# Patient Record
Sex: Female | Born: 1935 | Race: White | Hispanic: No | Marital: Single | Smoking: Never smoker
Health system: Southern US, Community
[De-identification: ages and names within clinical notes are randomized; demographics above are authoritative.]

---

## 2004-02-23 ENCOUNTER — Other Ambulatory Visit: Payer: Self-pay

## 2004-09-21 ENCOUNTER — Ambulatory Visit: Payer: Self-pay | Admitting: Internal Medicine

## 2004-10-25 ENCOUNTER — Ambulatory Visit: Payer: Self-pay | Admitting: Internal Medicine

## 2004-11-07 ENCOUNTER — Ambulatory Visit: Payer: Self-pay | Admitting: Internal Medicine

## 2004-11-20 ENCOUNTER — Ambulatory Visit: Payer: Self-pay | Admitting: Internal Medicine

## 2005-01-08 ENCOUNTER — Ambulatory Visit: Payer: Self-pay

## 2005-01-15 ENCOUNTER — Ambulatory Visit: Payer: Self-pay | Admitting: Psychiatry

## 2006-10-09 DIAGNOSIS — G40209 Localization-related (focal) (partial) symptomatic epilepsy and epileptic syndromes with complex partial seizures, not intractable, without status epilepticus: Secondary | ICD-10-CM | POA: Diagnosis present

## 2008-12-14 ENCOUNTER — Ambulatory Visit: Payer: Self-pay | Admitting: Internal Medicine

## 2014-07-20 ENCOUNTER — Emergency Department: Payer: Self-pay | Admitting: Emergency Medicine

## 2015-02-15 IMAGING — CT CT HEAD WITHOUT CONTRAST
5 of 6 series · 15 of 33 positions shown, 17 images · non-contrast
Comparison: None

CLINICAL DATA: Fall, was getting groceries out of car, tripped and
struck back of head on curb, alert and oriented, denies loss of
consciousness, initial encounter.

EXAM:
CT HEAD WITHOUT CONTRAST
CT CERVICAL SPINE WITHOUT CONTRAST
TECHNIQUE: Multidetector CT imaging of the head and cervical spine was
performed following the standard protocol without intravenous
contrast. Multiplanar CT image reconstructions of the cervical spine
were also generated.

[Series 3: head bone · axial · 0.44mm/px · z∈[-75,+9]mm · 3 of 108 slices shown]
[im 27/108  bone]
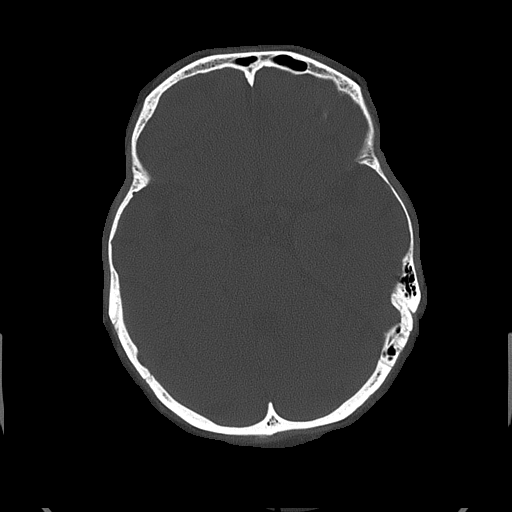
[im 54/108  bone]
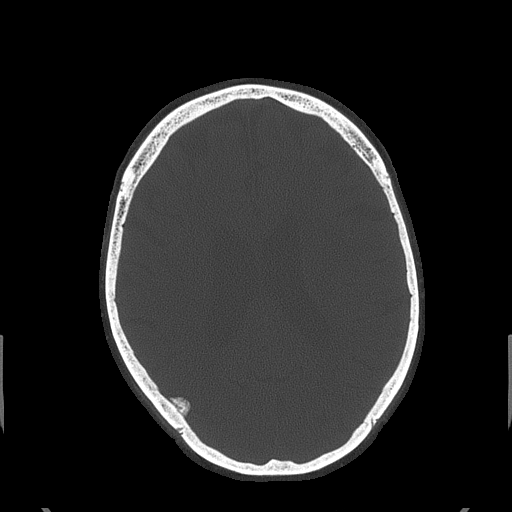
[im 81/108  bone]
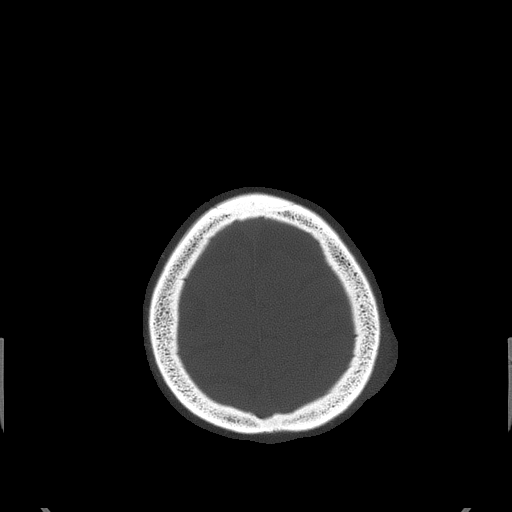

[Series 5: c spine soft · axial · 0.30mm/px · z∈[-232,-178]mm · 2 of 82 slices shown]
[im 28/82  soft-tissue]
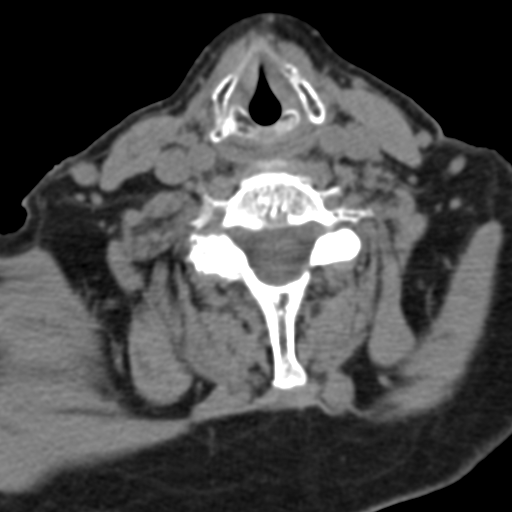
[im 55/82  soft-tissue]
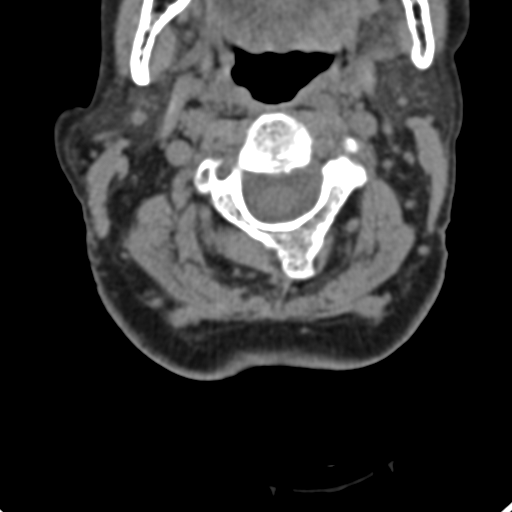

[Series 6: sag bone · sagittal · 0.34mm/px · 5 of 46 slices shown, 6 images]
[im 16/46  bone]
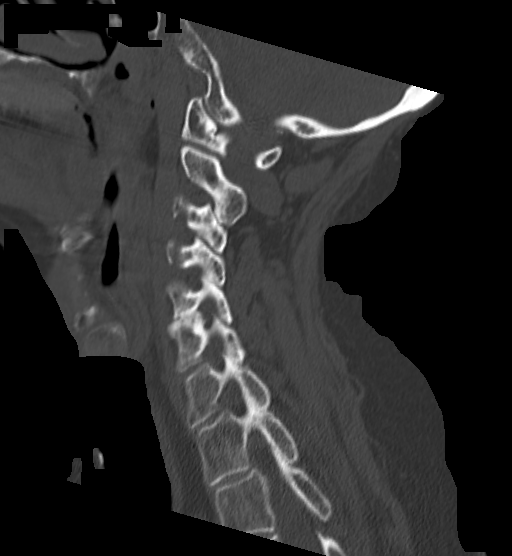
[im 19/46  bone]
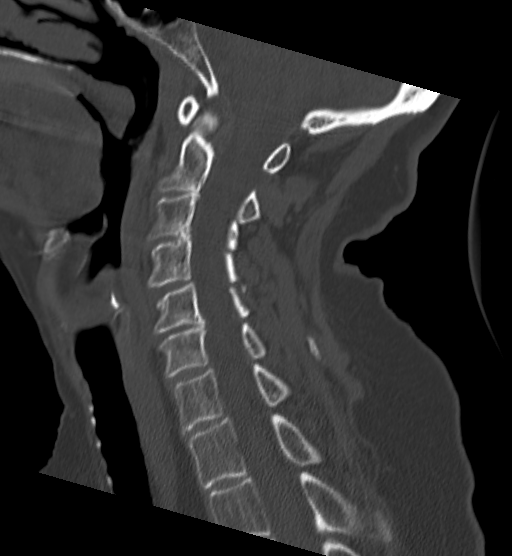
[im 23/46  soft-tissue]
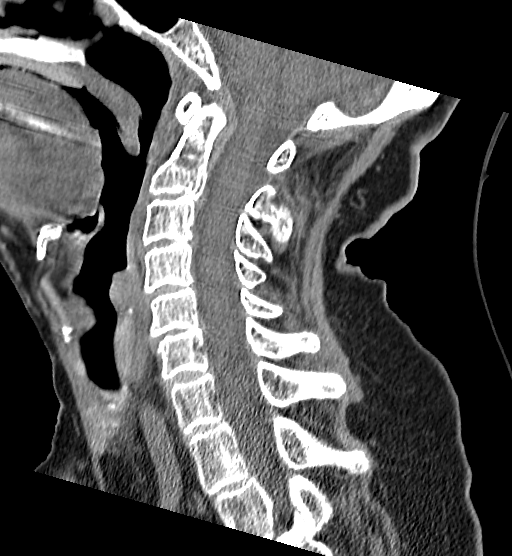
[im 23/46  bone]
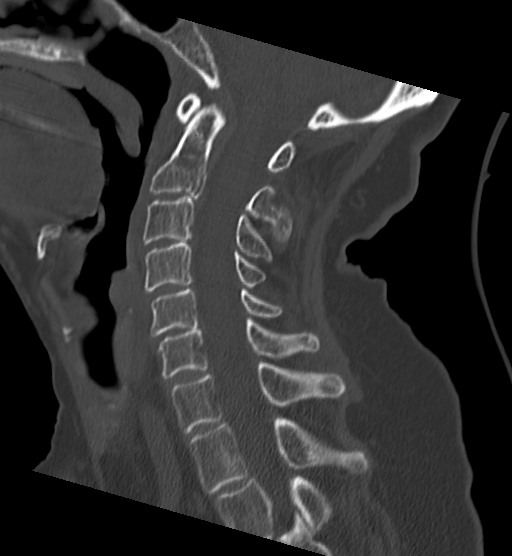
[im 27/46  bone]
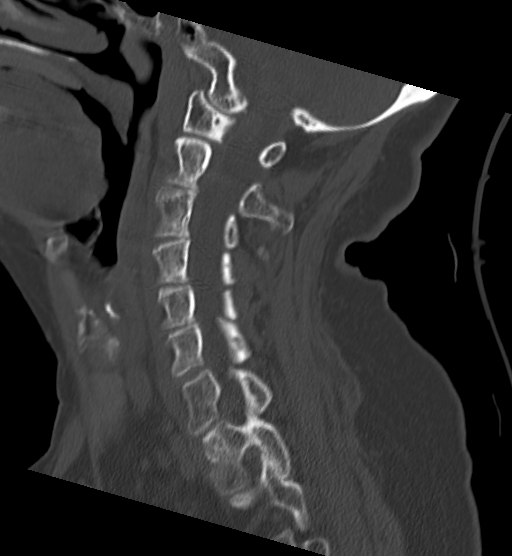
[im 31/46  bone]
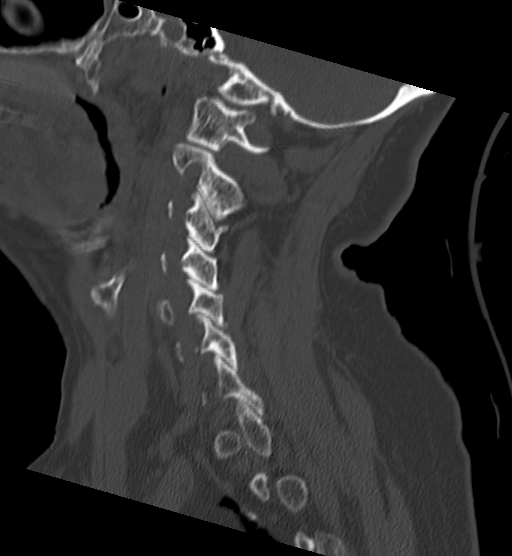

[Series 7: cor bone · coronal · 0.28mm/px · 3 of 44 slices shown]
[im 9/44  bone]
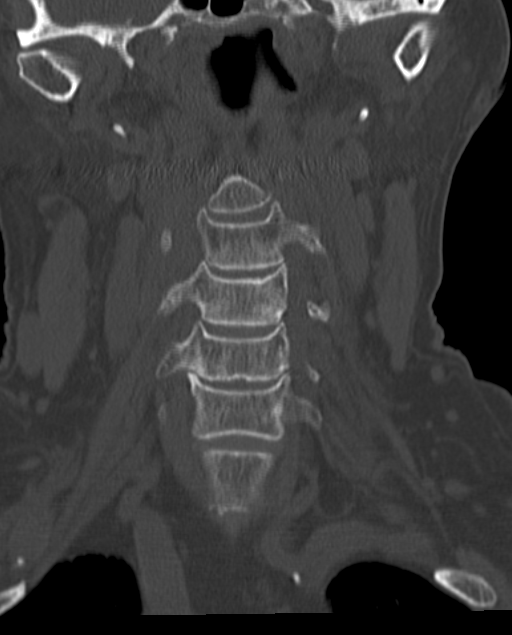
[im 18/44  bone]
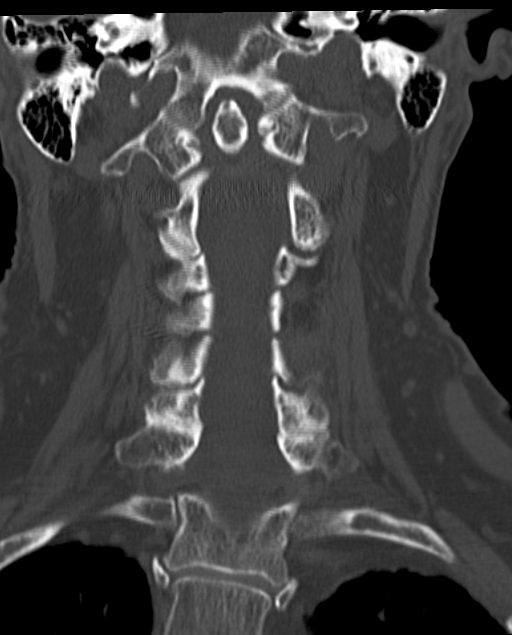
[im 26/44  bone]
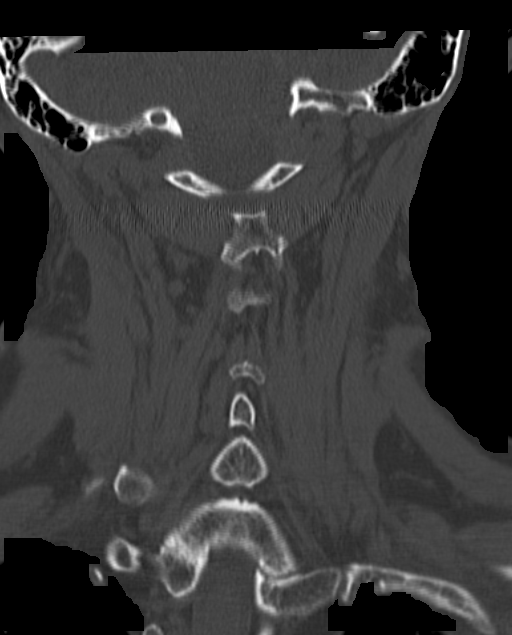

[Series 8: orthogonal axials · axial · 0.29mm/px · z∈[-256,-205]mm · 2 of 81 slices shown, 3 images]
[im 27/81  soft-tissue]
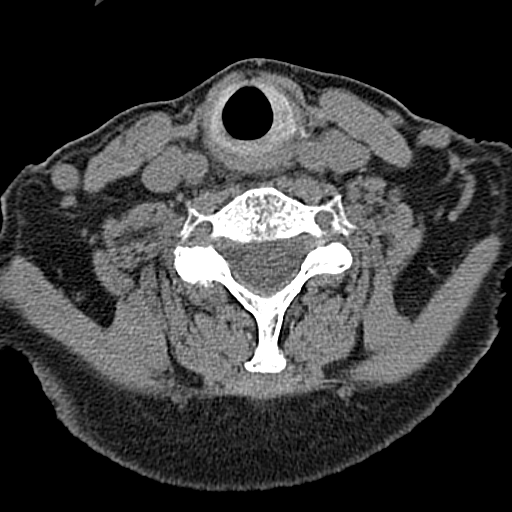
[im 27/81  bone]
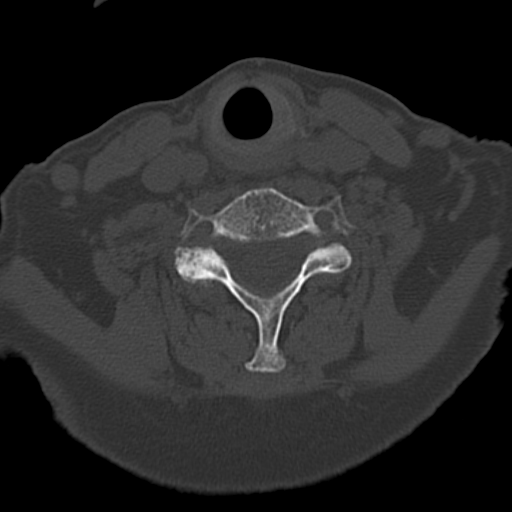
[im 54/81  bone]
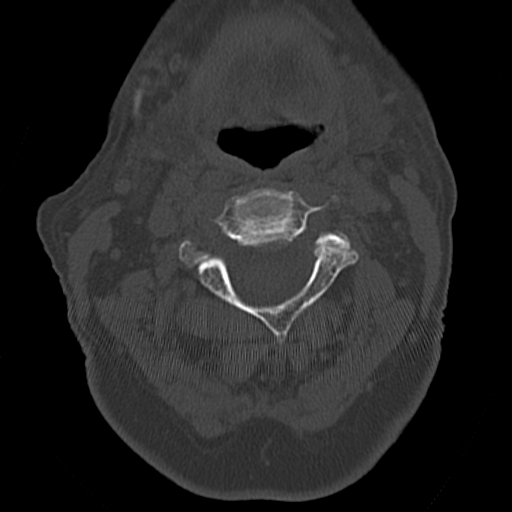

[15 of 33 positions shown; findings below may reference images not displayed]

FINDINGS: CT HEAD FINDINGS

Streak artifacts at skull base.

Generalized atrophy.

Normal ventricular morphology.

No midline shift or mass effect.

Small vessel chronic ischemic changes of deep cerebral white matter.

Small calcified extra-axial nodule at the posterior RIGHT parietal
region 11 x 6 mm question tiny calcified meningioma.

No intracranial hemorrhage, additional mass lesion, or acute
infarction.

Visualized paranasal sinuses and mastoid air cells clear.

Bones unremarkable.

CT CERVICAL SPINE FINDINGS

Visualized skullbase intact.

Prevertebral soft tissues normal thickness.

Bones appear demineralized.

Multilevel facet degenerative changes.

Disc space narrowing at C3-C4, C5-C6, to lesser degrees at remaining
levels.

Vertebral body heights maintained without fracture or subluxation.

Lung apices clear.

Question surgical absence of RIGHT thyroid lobe with tiny nodules in
LEFT thyroid lobe less than 10 mm diameter.
IMPRESSION: Atrophy with small vessel chronic ischemic changes of deep cerebral
white matter.

Suspect small calcified RIGHT parietal meningioma 11 x 6 mm.

No acute intracranial abnormalities.

Osseous demineralization with scattered degenerative disc and facet
disease changes as above.

No acute cervical spine abnormalities.

## 2015-02-15 IMAGING — CR DG LUMBAR SPINE 2-3V
1 series · 3 of 3 positions shown · non-contrast
Comparison: None.

CLINICAL DATA: Acute lower back pain after fall.

EXAM:
LUMBAR SPINE - 2-3 VIEW

[Series 1: dxr lumbar spine ap and lateral · 0.14mm/px · 3 of 3 slices shown]
[im 1/3]
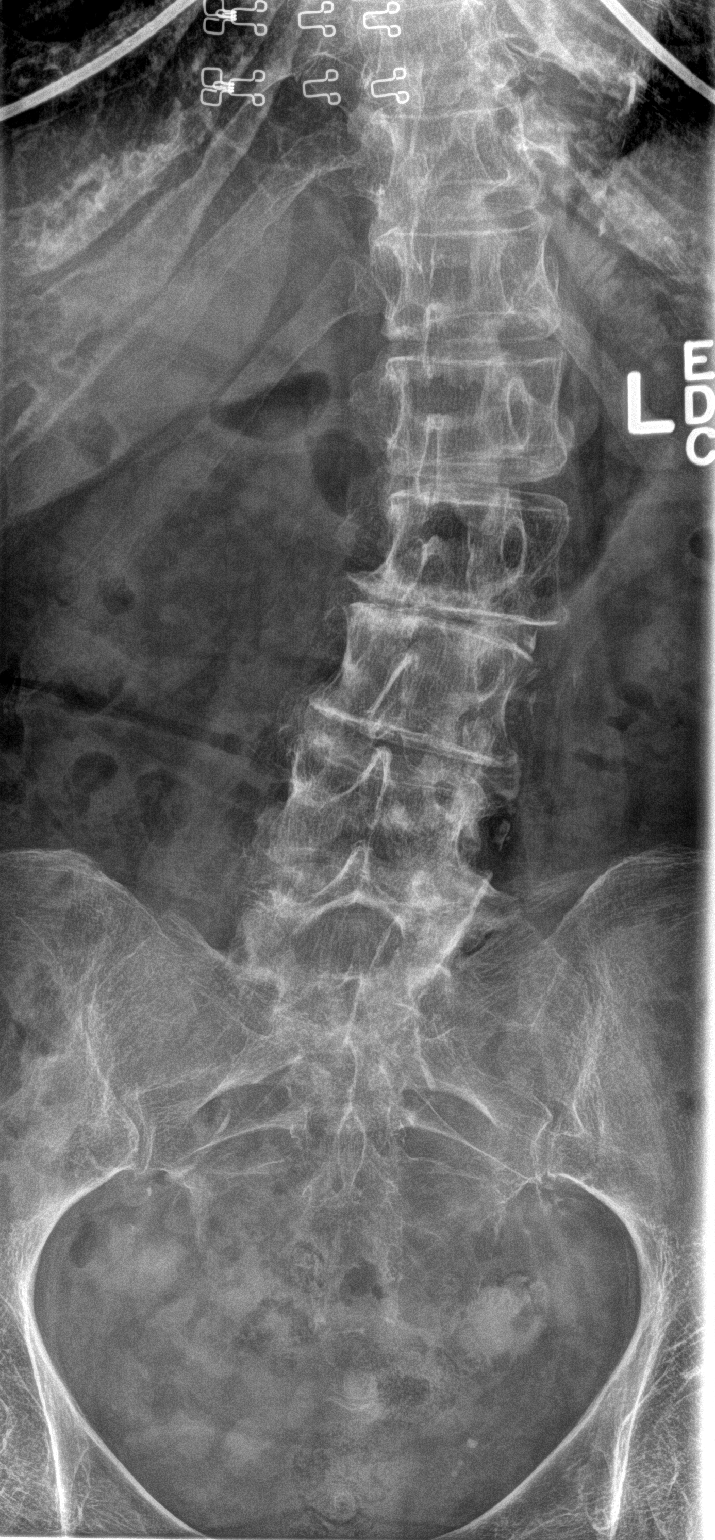
[im 2/3]
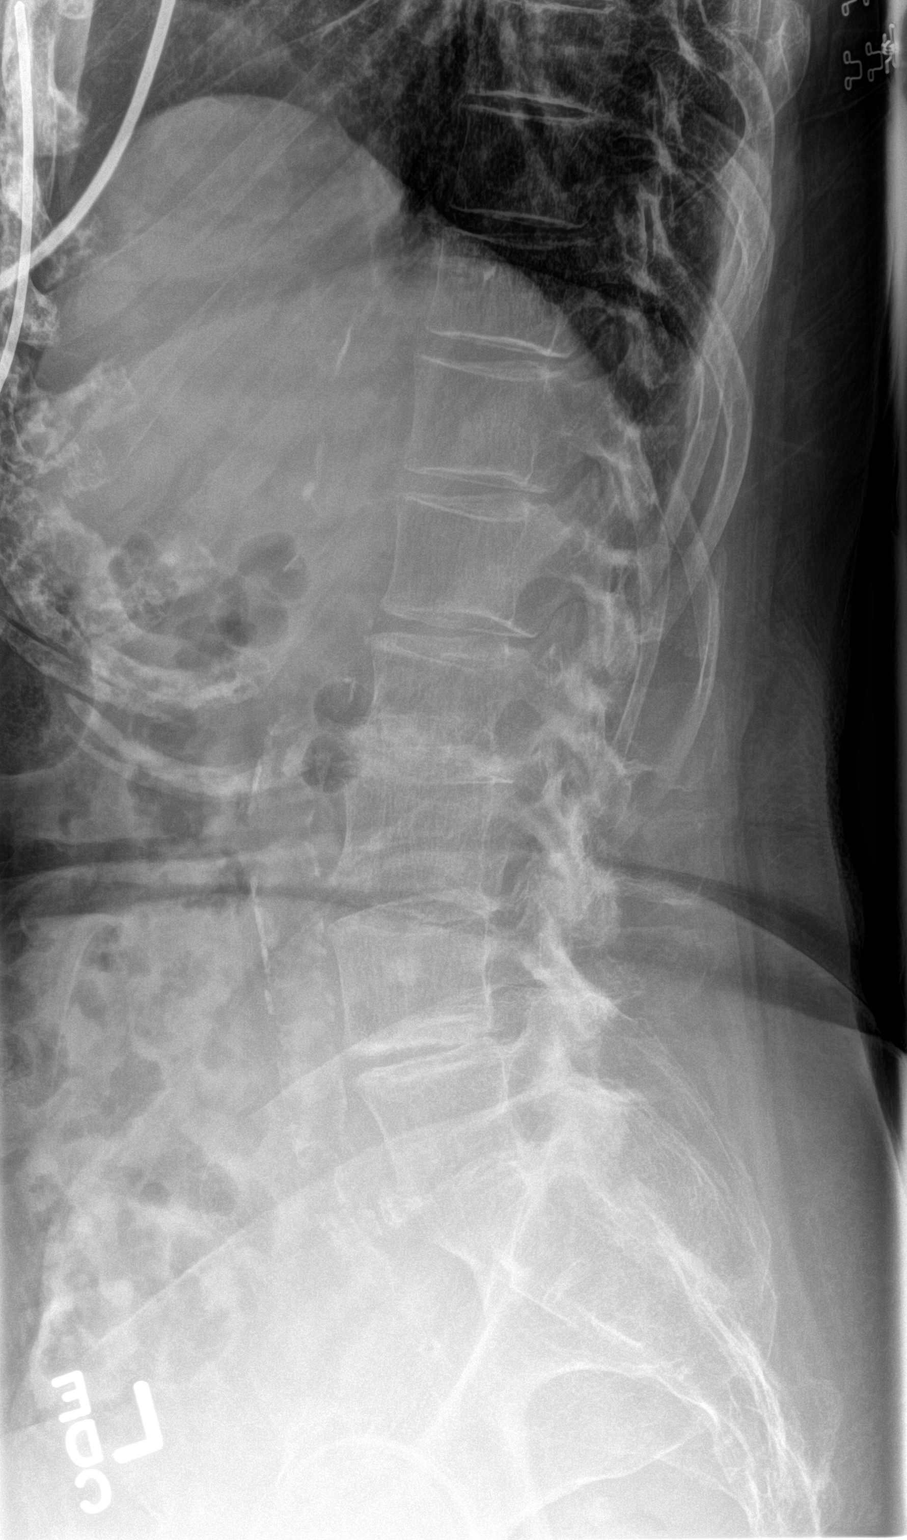
[im 3/3]
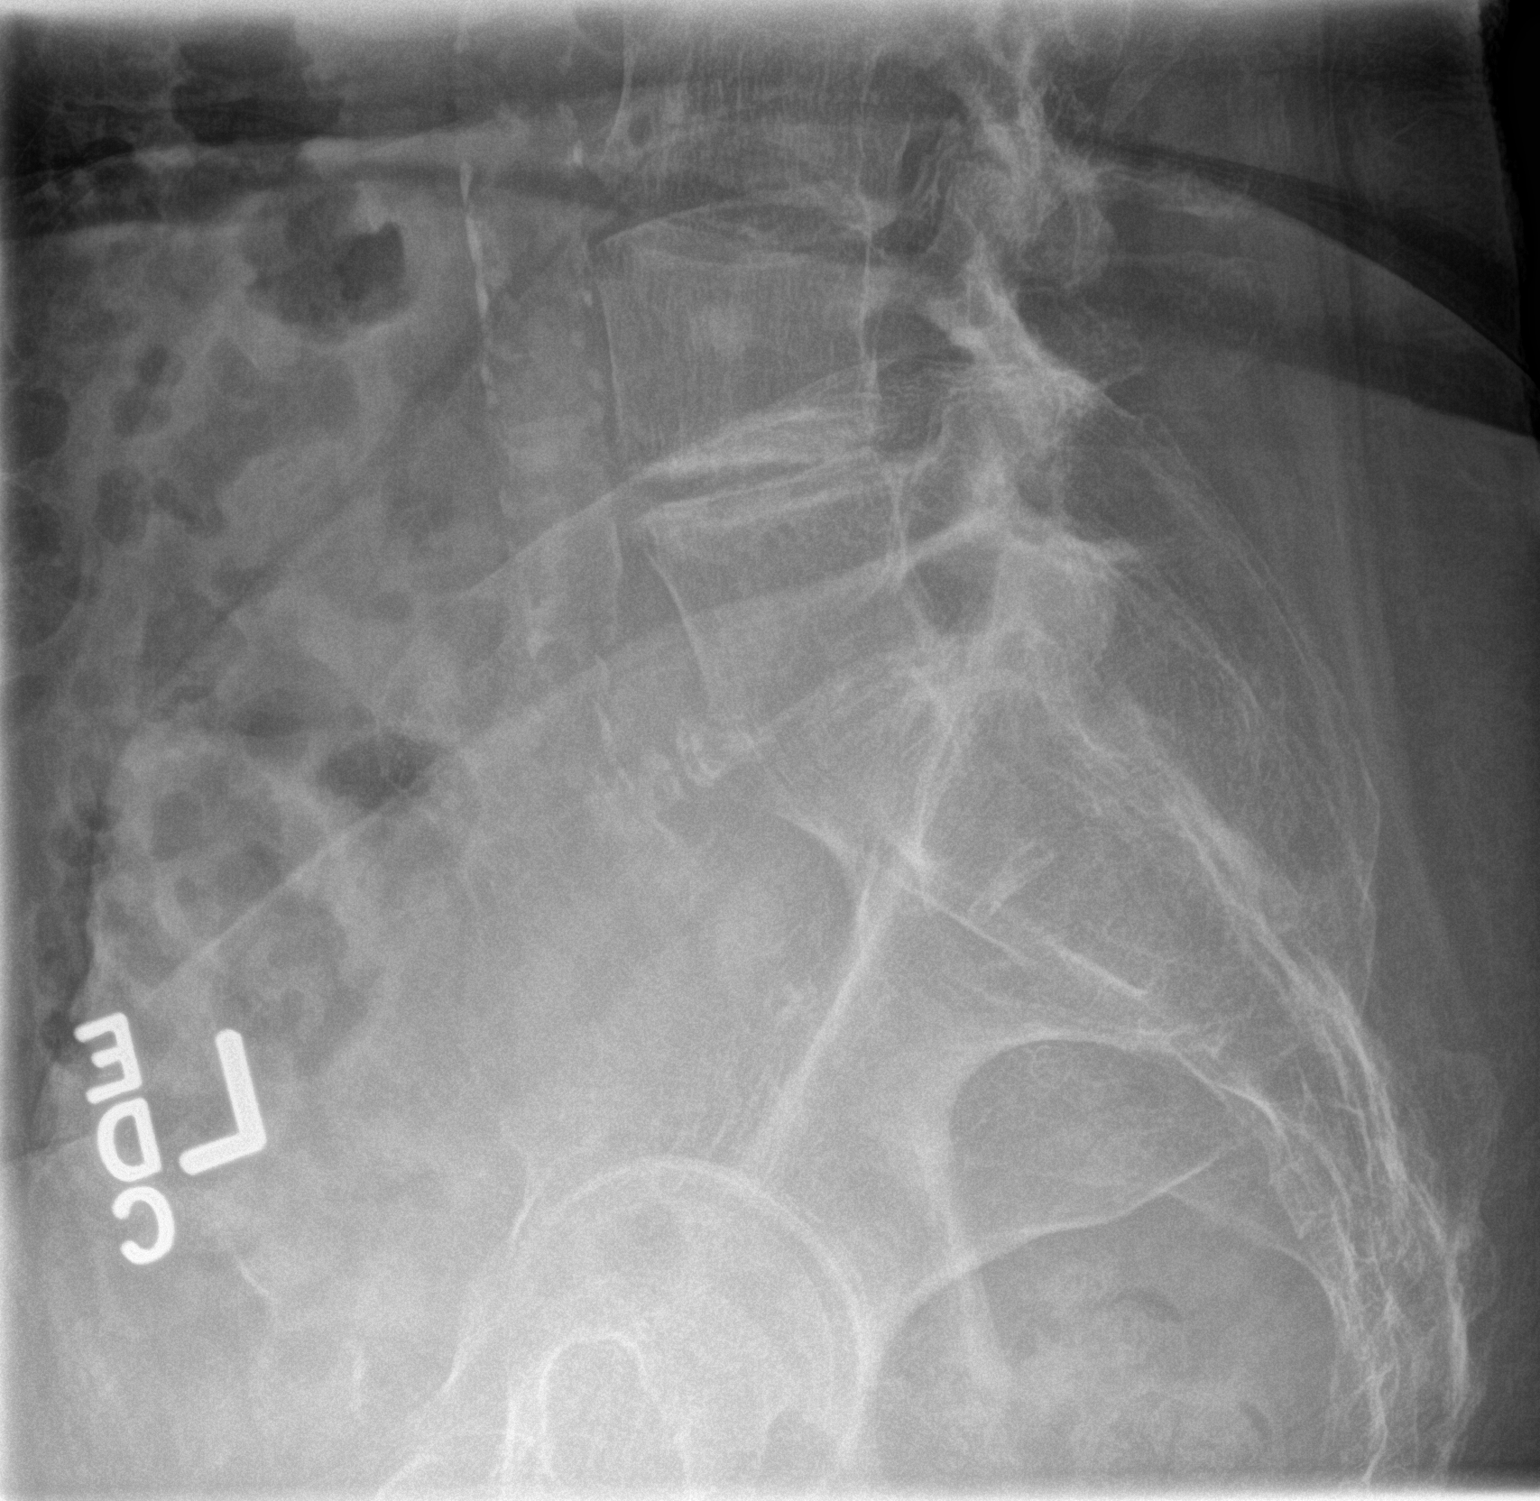

[3 of 3 positions shown; findings below may reference images not displayed]

FINDINGS: No fracture or spondylolisthesis is noted. Diffuse osteopenia is
noted. Atherosclerotic calcifications of abdominal aorta are noted.
Severe degenerative disc disease is noted at L2-3 and L5-S1. Mild
degenerative disc disease is noted at L4-5. Moderate levoscoliosis
of lumbar spine is noted.
IMPRESSION: Multilevel degenerative disc disease. No acute abnormality seen in
the lumbar spine.

## 2023-04-01 ENCOUNTER — Telehealth: Payer: Self-pay

## 2023-04-01 NOTE — Telephone Encounter (Unsigned)
Copied from CRM [id number]. Topic: Appointment Scheduling - Scheduling Inquiry for Clinic >> Apr 01, 2023  3:20 PM Franchot Heidelberg wrote: Reason for CRM: Pt's son called to report that the patient was established with Dr. Sherrie Mustache 9 years ago when he was with [facility]. Pt's son wants to know if Dr. Sherrie Mustache is willing to take her on as a returning patient  Best contact: [phone] Otilio Carpen

## 2023-04-02 NOTE — Telephone Encounter (Signed)
I've never been at [facility], and am not taking new patients. Maybe Dr. Payton Mccallum or Roxan Hockey can see her.

## 2023-04-03 NOTE — Telephone Encounter (Signed)
Left vm for to return call.  Okay for [facility] to advise and make appt with another provider that is accepting new pt's if they want.

## 2023-05-26 ENCOUNTER — Ambulatory Visit: Payer: Self-pay | Admitting: Physician Assistant

## 2023-06-05 DIAGNOSIS — G20A1 Parkinson's disease without dyskinesia, without mention of fluctuations: Secondary | ICD-10-CM | POA: Diagnosis not present

## 2023-06-05 DIAGNOSIS — I10 Essential (primary) hypertension: Secondary | ICD-10-CM | POA: Diagnosis not present

## 2023-06-05 DIAGNOSIS — R569 Unspecified convulsions: Secondary | ICD-10-CM | POA: Diagnosis not present

## 2023-06-05 DIAGNOSIS — R32 Unspecified urinary incontinence: Secondary | ICD-10-CM | POA: Diagnosis not present

## 2023-06-05 DIAGNOSIS — R1310 Dysphagia, unspecified: Secondary | ICD-10-CM | POA: Diagnosis not present

## 2023-06-07 DIAGNOSIS — R569 Unspecified convulsions: Secondary | ICD-10-CM | POA: Diagnosis not present

## 2023-06-07 DIAGNOSIS — G20A1 Parkinson's disease without dyskinesia, without mention of fluctuations: Secondary | ICD-10-CM | POA: Diagnosis not present

## 2023-06-07 DIAGNOSIS — R1310 Dysphagia, unspecified: Secondary | ICD-10-CM | POA: Diagnosis not present

## 2023-06-07 DIAGNOSIS — I10 Essential (primary) hypertension: Secondary | ICD-10-CM | POA: Diagnosis not present

## 2023-06-07 DIAGNOSIS — R32 Unspecified urinary incontinence: Secondary | ICD-10-CM | POA: Diagnosis not present

## 2023-06-09 DIAGNOSIS — G20A1 Parkinson's disease without dyskinesia, without mention of fluctuations: Secondary | ICD-10-CM | POA: Diagnosis not present

## 2023-06-09 DIAGNOSIS — R32 Unspecified urinary incontinence: Secondary | ICD-10-CM | POA: Diagnosis not present

## 2023-06-09 DIAGNOSIS — R1310 Dysphagia, unspecified: Secondary | ICD-10-CM | POA: Diagnosis not present

## 2023-06-09 DIAGNOSIS — R569 Unspecified convulsions: Secondary | ICD-10-CM | POA: Diagnosis not present

## 2023-06-09 DIAGNOSIS — I10 Essential (primary) hypertension: Secondary | ICD-10-CM | POA: Diagnosis not present

## 2023-06-10 DIAGNOSIS — G20A1 Parkinson's disease without dyskinesia, without mention of fluctuations: Secondary | ICD-10-CM | POA: Diagnosis not present

## 2023-06-12 DIAGNOSIS — I10 Essential (primary) hypertension: Secondary | ICD-10-CM | POA: Diagnosis not present

## 2023-06-12 DIAGNOSIS — R1310 Dysphagia, unspecified: Secondary | ICD-10-CM | POA: Diagnosis not present

## 2023-06-12 DIAGNOSIS — R569 Unspecified convulsions: Secondary | ICD-10-CM | POA: Diagnosis not present

## 2023-06-12 DIAGNOSIS — G20A1 Parkinson's disease without dyskinesia, without mention of fluctuations: Secondary | ICD-10-CM | POA: Diagnosis not present

## 2023-06-12 DIAGNOSIS — R32 Unspecified urinary incontinence: Secondary | ICD-10-CM | POA: Diagnosis not present

## 2023-06-13 DIAGNOSIS — G20A1 Parkinson's disease without dyskinesia, without mention of fluctuations: Secondary | ICD-10-CM | POA: Diagnosis not present

## 2023-06-13 DIAGNOSIS — I10 Essential (primary) hypertension: Secondary | ICD-10-CM | POA: Diagnosis not present

## 2023-06-13 DIAGNOSIS — R1310 Dysphagia, unspecified: Secondary | ICD-10-CM | POA: Diagnosis not present

## 2023-06-13 DIAGNOSIS — R569 Unspecified convulsions: Secondary | ICD-10-CM | POA: Diagnosis not present

## 2023-06-13 DIAGNOSIS — R32 Unspecified urinary incontinence: Secondary | ICD-10-CM | POA: Diagnosis not present

## 2023-06-25 DIAGNOSIS — R569 Unspecified convulsions: Secondary | ICD-10-CM | POA: Diagnosis not present

## 2023-06-25 DIAGNOSIS — R32 Unspecified urinary incontinence: Secondary | ICD-10-CM | POA: Diagnosis not present

## 2023-06-25 DIAGNOSIS — I10 Essential (primary) hypertension: Secondary | ICD-10-CM | POA: Diagnosis not present

## 2023-06-25 DIAGNOSIS — G20A1 Parkinson's disease without dyskinesia, without mention of fluctuations: Secondary | ICD-10-CM | POA: Diagnosis not present

## 2023-06-25 DIAGNOSIS — R1310 Dysphagia, unspecified: Secondary | ICD-10-CM | POA: Diagnosis not present

## 2023-06-26 DIAGNOSIS — R569 Unspecified convulsions: Secondary | ICD-10-CM | POA: Diagnosis not present

## 2023-06-26 DIAGNOSIS — R1310 Dysphagia, unspecified: Secondary | ICD-10-CM | POA: Diagnosis not present

## 2023-06-26 DIAGNOSIS — G20A1 Parkinson's disease without dyskinesia, without mention of fluctuations: Secondary | ICD-10-CM | POA: Diagnosis not present

## 2023-06-26 DIAGNOSIS — R32 Unspecified urinary incontinence: Secondary | ICD-10-CM | POA: Diagnosis not present

## 2023-06-26 DIAGNOSIS — I10 Essential (primary) hypertension: Secondary | ICD-10-CM | POA: Diagnosis not present

## 2023-06-27 DIAGNOSIS — R1310 Dysphagia, unspecified: Secondary | ICD-10-CM | POA: Diagnosis not present

## 2023-06-27 DIAGNOSIS — R569 Unspecified convulsions: Secondary | ICD-10-CM | POA: Diagnosis not present

## 2023-06-27 DIAGNOSIS — G20A1 Parkinson's disease without dyskinesia, without mention of fluctuations: Secondary | ICD-10-CM | POA: Diagnosis not present

## 2023-06-27 DIAGNOSIS — R32 Unspecified urinary incontinence: Secondary | ICD-10-CM | POA: Diagnosis not present

## 2023-06-27 DIAGNOSIS — I10 Essential (primary) hypertension: Secondary | ICD-10-CM | POA: Diagnosis not present

## 2023-07-03 DIAGNOSIS — G20A1 Parkinson's disease without dyskinesia, without mention of fluctuations: Secondary | ICD-10-CM | POA: Diagnosis not present

## 2023-07-03 DIAGNOSIS — R1310 Dysphagia, unspecified: Secondary | ICD-10-CM | POA: Diagnosis not present

## 2023-07-03 DIAGNOSIS — R32 Unspecified urinary incontinence: Secondary | ICD-10-CM | POA: Diagnosis not present

## 2023-07-03 DIAGNOSIS — I10 Essential (primary) hypertension: Secondary | ICD-10-CM | POA: Diagnosis not present

## 2023-07-03 DIAGNOSIS — R569 Unspecified convulsions: Secondary | ICD-10-CM | POA: Diagnosis not present

## 2023-07-04 DIAGNOSIS — I10 Essential (primary) hypertension: Secondary | ICD-10-CM | POA: Diagnosis not present

## 2023-07-04 DIAGNOSIS — G20A1 Parkinson's disease without dyskinesia, without mention of fluctuations: Secondary | ICD-10-CM | POA: Diagnosis not present

## 2023-07-04 DIAGNOSIS — R1310 Dysphagia, unspecified: Secondary | ICD-10-CM | POA: Diagnosis not present

## 2023-07-04 DIAGNOSIS — R569 Unspecified convulsions: Secondary | ICD-10-CM | POA: Diagnosis not present

## 2023-07-04 DIAGNOSIS — R32 Unspecified urinary incontinence: Secondary | ICD-10-CM | POA: Diagnosis not present

## 2023-07-05 DIAGNOSIS — R569 Unspecified convulsions: Secondary | ICD-10-CM | POA: Diagnosis not present

## 2023-07-05 DIAGNOSIS — I10 Essential (primary) hypertension: Secondary | ICD-10-CM | POA: Diagnosis not present

## 2023-07-05 DIAGNOSIS — R32 Unspecified urinary incontinence: Secondary | ICD-10-CM | POA: Diagnosis not present

## 2023-07-05 DIAGNOSIS — G20A1 Parkinson's disease without dyskinesia, without mention of fluctuations: Secondary | ICD-10-CM | POA: Diagnosis not present

## 2023-07-05 DIAGNOSIS — R1310 Dysphagia, unspecified: Secondary | ICD-10-CM | POA: Diagnosis not present

## 2023-07-09 DIAGNOSIS — R32 Unspecified urinary incontinence: Secondary | ICD-10-CM | POA: Diagnosis not present

## 2023-07-09 DIAGNOSIS — G20A1 Parkinson's disease without dyskinesia, without mention of fluctuations: Secondary | ICD-10-CM | POA: Diagnosis not present

## 2023-07-09 DIAGNOSIS — I10 Essential (primary) hypertension: Secondary | ICD-10-CM | POA: Diagnosis not present

## 2023-07-09 DIAGNOSIS — R569 Unspecified convulsions: Secondary | ICD-10-CM | POA: Diagnosis not present

## 2023-07-09 DIAGNOSIS — R1310 Dysphagia, unspecified: Secondary | ICD-10-CM | POA: Diagnosis not present

## 2023-07-10 DIAGNOSIS — G20A1 Parkinson's disease without dyskinesia, without mention of fluctuations: Secondary | ICD-10-CM | POA: Diagnosis not present

## 2023-07-10 DIAGNOSIS — R569 Unspecified convulsions: Secondary | ICD-10-CM | POA: Diagnosis not present

## 2023-07-10 DIAGNOSIS — R32 Unspecified urinary incontinence: Secondary | ICD-10-CM | POA: Diagnosis not present

## 2023-07-10 DIAGNOSIS — R1310 Dysphagia, unspecified: Secondary | ICD-10-CM | POA: Diagnosis not present

## 2023-07-10 DIAGNOSIS — I10 Essential (primary) hypertension: Secondary | ICD-10-CM | POA: Diagnosis not present

## 2023-07-11 DIAGNOSIS — L918 Other hypertrophic disorders of the skin: Secondary | ICD-10-CM | POA: Diagnosis not present

## 2023-07-11 DIAGNOSIS — G20A1 Parkinson's disease without dyskinesia, without mention of fluctuations: Secondary | ICD-10-CM | POA: Diagnosis not present

## 2023-07-11 DIAGNOSIS — F068 Other specified mental disorders due to known physiological condition: Secondary | ICD-10-CM | POA: Diagnosis not present

## 2023-07-11 DIAGNOSIS — N3946 Mixed incontinence: Secondary | ICD-10-CM | POA: Diagnosis not present

## 2023-07-17 DIAGNOSIS — R1310 Dysphagia, unspecified: Secondary | ICD-10-CM | POA: Diagnosis not present

## 2023-07-17 DIAGNOSIS — G20A1 Parkinson's disease without dyskinesia, without mention of fluctuations: Secondary | ICD-10-CM | POA: Diagnosis not present

## 2023-07-17 DIAGNOSIS — R32 Unspecified urinary incontinence: Secondary | ICD-10-CM | POA: Diagnosis not present

## 2023-07-17 DIAGNOSIS — R569 Unspecified convulsions: Secondary | ICD-10-CM | POA: Diagnosis not present

## 2023-07-17 DIAGNOSIS — I10 Essential (primary) hypertension: Secondary | ICD-10-CM | POA: Diagnosis not present

## 2023-07-18 DIAGNOSIS — R1310 Dysphagia, unspecified: Secondary | ICD-10-CM | POA: Diagnosis not present

## 2023-07-18 DIAGNOSIS — I10 Essential (primary) hypertension: Secondary | ICD-10-CM | POA: Diagnosis not present

## 2023-07-18 DIAGNOSIS — R32 Unspecified urinary incontinence: Secondary | ICD-10-CM | POA: Diagnosis not present

## 2023-07-18 DIAGNOSIS — R569 Unspecified convulsions: Secondary | ICD-10-CM | POA: Diagnosis not present

## 2023-07-18 DIAGNOSIS — G20A1 Parkinson's disease without dyskinesia, without mention of fluctuations: Secondary | ICD-10-CM | POA: Diagnosis not present

## 2023-07-22 DIAGNOSIS — G20A1 Parkinson's disease without dyskinesia, without mention of fluctuations: Secondary | ICD-10-CM | POA: Diagnosis not present

## 2023-07-22 DIAGNOSIS — R1310 Dysphagia, unspecified: Secondary | ICD-10-CM | POA: Diagnosis not present

## 2023-07-22 DIAGNOSIS — R569 Unspecified convulsions: Secondary | ICD-10-CM | POA: Diagnosis not present

## 2023-07-22 DIAGNOSIS — I10 Essential (primary) hypertension: Secondary | ICD-10-CM | POA: Diagnosis not present

## 2023-07-22 DIAGNOSIS — R32 Unspecified urinary incontinence: Secondary | ICD-10-CM | POA: Diagnosis not present

## 2023-07-23 DIAGNOSIS — R1310 Dysphagia, unspecified: Secondary | ICD-10-CM | POA: Diagnosis not present

## 2023-07-23 DIAGNOSIS — R569 Unspecified convulsions: Secondary | ICD-10-CM | POA: Diagnosis not present

## 2023-07-23 DIAGNOSIS — R32 Unspecified urinary incontinence: Secondary | ICD-10-CM | POA: Diagnosis not present

## 2023-07-23 DIAGNOSIS — G20A1 Parkinson's disease without dyskinesia, without mention of fluctuations: Secondary | ICD-10-CM | POA: Diagnosis not present

## 2023-07-23 DIAGNOSIS — I10 Essential (primary) hypertension: Secondary | ICD-10-CM | POA: Diagnosis not present

## 2023-08-01 DIAGNOSIS — G20A1 Parkinson's disease without dyskinesia, without mention of fluctuations: Secondary | ICD-10-CM | POA: Diagnosis not present

## 2023-08-01 DIAGNOSIS — R1310 Dysphagia, unspecified: Secondary | ICD-10-CM | POA: Diagnosis not present

## 2023-08-01 DIAGNOSIS — I10 Essential (primary) hypertension: Secondary | ICD-10-CM | POA: Diagnosis not present

## 2023-08-01 DIAGNOSIS — R32 Unspecified urinary incontinence: Secondary | ICD-10-CM | POA: Diagnosis not present

## 2023-08-01 DIAGNOSIS — R569 Unspecified convulsions: Secondary | ICD-10-CM | POA: Diagnosis not present

## 2023-11-27 ENCOUNTER — Observation Stay
Admission: EM | Admit: 2023-11-27 | Discharge: 2023-11-29 | Disposition: A | Discharge status: Hospice | Attending: Internal Medicine | Admitting: Internal Medicine

## 2023-11-27 ENCOUNTER — Emergency Department

## 2023-11-27 ENCOUNTER — Observation Stay

## 2023-11-27 ENCOUNTER — Other Ambulatory Visit: Payer: Self-pay

## 2023-11-27 DIAGNOSIS — R41841 Cognitive communication deficit: Secondary | ICD-10-CM | POA: Diagnosis not present

## 2023-11-27 DIAGNOSIS — R32 Unspecified urinary incontinence: Secondary | ICD-10-CM | POA: Insufficient documentation

## 2023-11-27 DIAGNOSIS — I671 Cerebral aneurysm, nonruptured: Secondary | ICD-10-CM | POA: Insufficient documentation

## 2023-11-27 DIAGNOSIS — K5900 Constipation, unspecified: Secondary | ICD-10-CM | POA: Diagnosis present

## 2023-11-27 DIAGNOSIS — D329 Benign neoplasm of meninges, unspecified: Secondary | ICD-10-CM | POA: Diagnosis not present

## 2023-11-27 DIAGNOSIS — Z79899 Other long term (current) drug therapy: Secondary | ICD-10-CM | POA: Diagnosis not present

## 2023-11-27 DIAGNOSIS — I10 Essential (primary) hypertension: Secondary | ICD-10-CM | POA: Diagnosis not present

## 2023-11-27 DIAGNOSIS — I6601 Occlusion and stenosis of right middle cerebral artery: Secondary | ICD-10-CM | POA: Diagnosis not present

## 2023-11-27 DIAGNOSIS — F419 Anxiety disorder, unspecified: Secondary | ICD-10-CM

## 2023-11-27 DIAGNOSIS — I708 Atherosclerosis of other arteries: Secondary | ICD-10-CM | POA: Diagnosis not present

## 2023-11-27 DIAGNOSIS — F0280 Dementia in other diseases classified elsewhere without behavioral disturbance: Secondary | ICD-10-CM | POA: Insufficient documentation

## 2023-11-27 DIAGNOSIS — G40109 Localization-related (focal) (partial) symptomatic epilepsy and epileptic syndromes with simple partial seizures, not intractable, without status epilepticus: Secondary | ICD-10-CM | POA: Diagnosis not present

## 2023-11-27 DIAGNOSIS — I6782 Cerebral ischemia: Secondary | ICD-10-CM | POA: Diagnosis not present

## 2023-11-27 DIAGNOSIS — I700 Atherosclerosis of aorta: Secondary | ICD-10-CM | POA: Insufficient documentation

## 2023-11-27 DIAGNOSIS — R400 Somnolence: Secondary | ICD-10-CM | POA: Diagnosis present

## 2023-11-27 DIAGNOSIS — I63511 Cerebral infarction due to unspecified occlusion or stenosis of right middle cerebral artery: Secondary | ICD-10-CM | POA: Diagnosis not present

## 2023-11-27 DIAGNOSIS — I6523 Occlusion and stenosis of bilateral carotid arteries: Secondary | ICD-10-CM | POA: Diagnosis not present

## 2023-11-27 DIAGNOSIS — D72829 Elevated white blood cell count, unspecified: Secondary | ICD-10-CM | POA: Insufficient documentation

## 2023-11-27 DIAGNOSIS — R2990 Unspecified symptoms and signs involving the nervous system: Secondary | ICD-10-CM | POA: Diagnosis present

## 2023-11-27 DIAGNOSIS — R29818 Other symptoms and signs involving the nervous system: Secondary | ICD-10-CM | POA: Diagnosis present

## 2023-11-27 DIAGNOSIS — G20C Parkinsonism, unspecified: Secondary | ICD-10-CM | POA: Diagnosis not present

## 2023-11-27 DIAGNOSIS — F418 Other specified anxiety disorders: Secondary | ICD-10-CM | POA: Diagnosis not present

## 2023-11-27 DIAGNOSIS — I639 Cerebral infarction, unspecified: Principal | ICD-10-CM

## 2023-11-27 DIAGNOSIS — R569 Unspecified convulsions: Secondary | ICD-10-CM

## 2023-11-27 DIAGNOSIS — F32A Depression, unspecified: Secondary | ICD-10-CM | POA: Diagnosis present

## 2023-11-27 DIAGNOSIS — G40209 Localization-related (focal) (partial) symptomatic epilepsy and epileptic syndromes with complex partial seizures, not intractable, without status epilepticus: Secondary | ICD-10-CM | POA: Diagnosis not present

## 2023-11-27 DIAGNOSIS — F0390 Unspecified dementia without behavioral disturbance: Secondary | ICD-10-CM

## 2023-11-27 DIAGNOSIS — G20A1 Parkinson's disease without dyskinesia, without mention of fluctuations: Secondary | ICD-10-CM | POA: Diagnosis present

## 2023-11-27 LAB — COMPREHENSIVE METABOLIC PANEL WITH GFR
ALT: 7 U/L (ref 0–44)
AST: 19 U/L (ref 15–41)
Albumin: 3.3 g/dL — ABNORMAL LOW (ref 3.5–5.0)
Alkaline Phosphatase: 47 U/L (ref 38–126)
Anion gap: 11 (ref 5–15)
BUN: 19 mg/dL (ref 8–23)
CO2: 26 mmol/L (ref 22–32)
Calcium: 8.4 mg/dL — ABNORMAL LOW (ref 8.9–10.3)
Chloride: 100 mmol/L (ref 98–111)
Creatinine, Ser: 0.75 mg/dL (ref 0.44–1.00)
GFR, Estimated: >60 mL/min (ref >60)
Glucose, Bld: 117 mg/dL — ABNORMAL HIGH (ref 70–99)
Potassium: 3.7 mmol/L (ref 3.5–5.1)
Sodium: 137 mmol/L (ref 135–145)
Total Bilirubin: 0.9 mg/dL (ref 0.0–1.2)
Total Protein: 6.5 g/dL (ref 6.5–8.1)

## 2023-11-27 LAB — URINE DRUG SCREEN, QUALITATIVE (ARMC ONLY)
Amphetamines, Ur Screen: NOT DETECTED
Barbiturates, Ur Screen: NOT DETECTED
Benzodiazepine, Ur Scrn: NOT DETECTED
Cannabinoid 50 Ng, Ur ~~LOC~~: NOT DETECTED
Cocaine Metabolite,Ur ~~LOC~~: NOT DETECTED
MDMA (Ecstasy)Ur Screen: NOT DETECTED
Methadone Scn, Ur: NOT DETECTED
Opiate, Ur Screen: NOT DETECTED
Phencyclidine (PCP) Ur S: NOT DETECTED
Tricyclic, Ur Screen: NOT DETECTED

## 2023-11-27 LAB — DIFFERENTIAL
Abs Immature Granulocytes: 0.06 10*3/uL (ref 0.00–0.07)
Basophils Absolute: 0 10*3/uL (ref 0.0–0.1)
Basophils Relative: 0 %
Eosinophils Absolute: 0.1 10*3/uL (ref 0.0–0.5)
Eosinophils Relative: 1 %
Immature Granulocytes: 1 %
Lymphocytes Relative: 8 %
Lymphs Abs: 0.9 10*3/uL (ref 0.7–4.0)
Monocytes Absolute: 0.3 10*3/uL (ref 0.1–1.0)
Monocytes Relative: 3 %
Neutro Abs: 9.5 10*3/uL — ABNORMAL HIGH (ref 1.7–7.7)
Neutrophils Relative %: 87 %
Smear Review: NORMAL

## 2023-11-27 LAB — URINALYSIS, ROUTINE W REFLEX MICROSCOPIC
Bilirubin Urine: NEGATIVE
Glucose, UA: NEGATIVE mg/dL
Ketones, ur: 20 mg/dL — AB
Leukocytes,Ua: NEGATIVE
Nitrite: NEGATIVE
Protein, ur: NEGATIVE mg/dL
Specific Gravity, Urine: 1.036 — ABNORMAL HIGH (ref 1.005–1.030)
pH: 6 (ref 5.0–8.0)

## 2023-11-27 LAB — CBC
HCT: 41.4 % (ref 36.0–46.0)
Hemoglobin: 13.1 g/dL (ref 12.0–15.0)
MCH: 30.9 pg (ref 26.0–34.0)
MCHC: 31.6 g/dL (ref 30.0–36.0)
MCV: 97.6 fL (ref 80.0–100.0)
Platelets: 262 10*3/uL (ref 150–400)
RBC: 4.24 MIL/uL (ref 3.87–5.11)
RDW: 13.3 % (ref 11.5–15.5)
WBC: 10.9 10*3/uL — ABNORMAL HIGH (ref 4.0–10.5)
nRBC: 0 % (ref 0.0–0.2)

## 2023-11-27 LAB — PROTIME-INR
INR: 1 (ref 0.8–1.2)
Prothrombin Time: 13.6 s (ref 11.4–15.2)

## 2023-11-27 LAB — CBG MONITORING, ED: Glucose-Capillary: 127 mg/dL — ABNORMAL HIGH (ref 70–99)

## 2023-11-27 LAB — ETHANOL: Alcohol, Ethyl (B): <10 mg/dL (ref <10)

## 2023-11-27 LAB — APTT: aPTT: 26 s (ref 24–36)

## 2023-11-27 MED ORDER — ASPIRIN 81 MG PO TBEC
81.0000 mg | DELAYED_RELEASE_TABLET | Freq: Every day | ORAL | Status: DC
  Administered 2023-11-28 – 2023-11-29 (×2): 81 mg via ORAL
  Filled 2023-11-27 (×2): qty 1

## 2023-11-27 MED ORDER — ASPIRIN 81 MG PO CHEW
324.0000 mg | CHEWABLE_TABLET | Freq: Once | ORAL | Status: AC
Start: 2023-11-27 — End: 2023-11-27
  Administered 2023-11-27: 324 mg via ORAL
  Filled 2023-11-27: qty 4

## 2023-11-27 MED ORDER — ENOXAPARIN SODIUM 40 MG/0.4ML IJ SOSY
40.0000 mg | PREFILLED_SYRINGE | INTRAMUSCULAR | Status: DC
  Administered 2023-11-27 – 2023-11-28 (×2): 40 mg via SUBCUTANEOUS
  Filled 2023-11-27 (×2): qty 0.4

## 2023-11-27 MED ORDER — LEVETIRACETAM ER 750 MG PO TB24
750.0000 mg | ORAL_TABLET | Freq: Every day | ORAL | Status: DC
  Administered 2023-11-27 – 2023-11-28 (×2): 750 mg via ORAL
  Filled 2023-11-27 (×2): qty 1

## 2023-11-27 MED ORDER — ACETAMINOPHEN 325 MG PO TABS: 650.0000 mg | ORAL_TABLET | ORAL | Status: DC | PRN

## 2023-11-27 MED ORDER — BISACODYL 10 MG RE SUPP: 10.0000 mg | Freq: Every day | RECTAL | Status: DC | PRN

## 2023-11-27 MED ORDER — IOHEXOL 350 MG/ML SOLN
100.0000 mL | Freq: Once | INTRAVENOUS | Status: AC | PRN
  Administered 2023-11-27: 100 mL via INTRAVENOUS

## 2023-11-27 MED ORDER — SERTRALINE HCL 50 MG PO TABS
50.0000 mg | ORAL_TABLET | Freq: Every day | ORAL | Status: DC
  Administered 2023-11-27 – 2023-11-29 (×3): 50 mg via ORAL
  Filled 2023-11-27 (×3): qty 1

## 2023-11-27 MED ORDER — STROKE: EARLY STAGES OF RECOVERY BOOK: Freq: Once | Status: AC

## 2023-11-27 MED ORDER — LORAZEPAM 0.5 MG PO TABS: 0.5000 mg | ORAL_TABLET | Freq: Four times a day (QID) | ORAL | Status: DC | PRN

## 2023-11-27 MED ORDER — ACETAMINOPHEN 650 MG RE SUPP: 650.0000 mg | RECTAL | Status: DC | PRN

## 2023-11-27 MED ORDER — CARBIDOPA-LEVODOPA 25-100 MG PO TABS
1.0000 | ORAL_TABLET | Freq: Three times a day (TID) | ORAL | Status: DC
  Administered 2023-11-27 – 2023-11-29 (×5): 1 via ORAL
  Filled 2023-11-27 (×5): qty 1

## 2023-11-27 MED ORDER — ACETAMINOPHEN 160 MG/5ML PO SOLN: 650.0000 mg | ORAL | Status: DC | PRN

## 2023-11-27 MED ORDER — OXYBUTYNIN CHLORIDE ER 5 MG PO TB24
5.0000 mg | ORAL_TABLET | Freq: Every day | ORAL | Status: DC
  Administered 2023-11-27 – 2023-11-28 (×2): 5 mg via ORAL
  Filled 2023-11-27 (×2): qty 1

## 2023-11-27 NOTE — Assessment & Plan Note (Signed)
 Teleneuro Recommendations:         Neuro Checks       Bedside Swallow Eval       DVT Prophylaxis       IV Fluids, Normal Saline       Head of Bed 30 Degrees       Euglycemia and Avoid Hyperthermia (PRN Acetaminophen)       Initiate or continue Aspirin 81 MG daily       Antihypertensives PRN if Blood pressure is greater than 220/120 or there is a concern for End organ damage/contraindications for permissive HTN. If blood pressure is greater than 220/120 give labetalol PO or IV or Vasotec IV with a goal of 15% reduction in BP during the first 24 hours.  Neurology consult to follow

## 2023-11-27 NOTE — ED Notes (Signed)
 Teleneurology activated spoke with Carrie Singleton patient is in CT Scan

## 2023-11-27 NOTE — ED Notes (Signed)
 Code Stroke activated in the field with EMS to [facility]

## 2023-11-27 NOTE — H&P (Signed)
 History and Physical    Patient: Carrie Singleton:[id number] DOB: 1926/03/04 DOA: 11/27/2023 DOS: the patient was seen and examined on 11/27/2023 PCP: Pcp, No  Patient coming from: Home  Chief Complaint:  Chief Complaint  Patient presents with   Code Stroke    HPI: Carrie Singleton is a 89 y.o. 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  Last known well was at 10 PM on 3/26 and son returned home from work at 5:30 PM on 3/627 to find her with left-sided weakness and a left facial droop. Son said that in the past two months she has been sleeping a lot during the day and he believes she has not been taking her medication as she should.  At baseline, patient ambulates with a walker, but is pretty independent and is able to prepare her breakfast and feed herself.  She does have hospice aides that come in 3 times a week to give her a bath. Son called EMS when he noticed her deficits .  Code stroke was activated in the field . Initial head CT negative and CTA head and neck negative for LVO. She was seen in consultation by teleneurology who Recommended admission for stroke workup (see assessment and plan) Additional ED course: Initial BP 158/102 with otherwise normal vitals Labs including CBC with differential, CMP and EtOH all unremarkable except for WBC of 10,900. EKG, personally viewed and interpreted showing sinus rhythm at 85 with no acute ST-T wave changes  NIHSS at the time of teleneuro appointment was 6.  Patient had improvement in the left-sided deficit at the time of admission however it is noted PCP note 03/2023 that she has had left leg weakness  for many years due to an accident hospitalist consulted for admission.     Social History:  has no history on file for tobacco use, alcohol use, and drug use.  Allergies  Allergen Reactions   Shellfish Allergy Swelling    Shrimp  Throat swelling   Grapefruit Extract Other (See Comments)    Contraindicated due to medication    No family history on file.  Prior to Admission medications   Not on File    Physical Exam: Vitals:   11/27/23 2030 11/27/23 2050 11/27/23 2056 11/27/23 2140  BP: (!) 193/79 (!) 193/90  (!) 188/75  Pulse: 88 96  83  Resp:  19  (!) 21  Temp:   99.3 F (37.4 C)   TempSrc:   Oral   SpO2: 97% 95%  95%  Weight:      Height:       Physical Exam Vitals and nursing note reviewed.  Constitutional:      General: She is not in acute distress.    Comments: Frail-appearing elderly female  HENT:     Head: Normocephalic and atraumatic.  Cardiovascular:     Rate and Rhythm: Normal rate and regular rhythm.     Heart sounds: Normal heart sounds.  Pulmonary:     Effort: Pulmonary effort is normal.     Breath sounds: Normal breath sounds.  Abdominal:     Palpations: Abdomen is soft.     Tenderness: There is no abdominal tenderness.  Neurological:  Mental Status: Mental status is at baseline.     Labs on Admission: I have personally reviewed following labs and imaging studies  CBC: Recent Labs  Lab 11/27/23 1945  WBC 10.9*  NEUTROABS 9.5*  HGB 13.1  HCT 41.4  MCV 97.6  PLT 262   Basic Metabolic Panel: Recent Labs  Lab 11/27/23 2055  NA 137  K 3.7  CL 100  CO2 26  GLUCOSE 117*  BUN 19  CREATININE 0.75  CALCIUM 8.4*   GFR: Estimated Creatinine Clearance: 31.7 mL/min (by C-G formula based on SCr of 0.75 mg/dL). Liver Function Tests: Recent Labs  Lab 11/27/23 2055  AST 19  ALT 7  ALKPHOS 47  BILITOT 0.9  PROT 6.5  ALBUMIN 3.3*   No results for input(s): "LIPASE", "AMYLASE" in the last 168 hours. No results for input(s):  "AMMONIA" in the last 168 hours. Coagulation Profile: Recent Labs  Lab 11/27/23 1945  INR 1.0   Cardiac Enzymes: No results for input(s): "CKTOTAL", "CKMB", "CKMBINDEX", "TROPONINI" in the last 168 hours. BNP (last 3 results) No results for input(s): "PROBNP" in the last 8760 hours. HbA1C: No results for input(s): "HGBA1C" in the last 72 hours. CBG: Recent Labs  Lab 11/27/23 2014  GLUCAP 127*   Lipid Profile: No results for input(s): "CHOL", "HDL", "LDLCALC", "TRIG", "CHOLHDL", "LDLDIRECT" in the last 72 hours. Thyroid Function Tests: No results for input(s): "TSH", "T4TOTAL", "FREET4", "T3FREE", "THYROIDAB" in the last 72 hours. Anemia Panel: No results for input(s): "VITAMINB12", "FOLATE", "FERRITIN", "TIBC", "IRON", "RETICCTPCT" in the last 72 hours. Urine analysis: No results found for: "COLORURINE", "APPEARANCEUR", "LABSPEC", "PHURINE", "GLUCOSEU", "HGBUR", "BILIRUBINUR", "KETONESUR", "PROTEINUR", "UROBILINOGEN", "NITRITE", "LEUKOCYTESUR"  Radiological Exams on Admission: CT ANGIO HEAD NECK W WO CM W PERF (CODE STROKE) Result Date: 11/27/2023 CLINICAL DATA:  Stroke EXAM: CT ANGIOGRAPHY HEAD AND NECK CT PERFUSION BRAIN TECHNIQUE: Multidetector CT imaging of the head and neck was performed using the standard protocol during bolus administration of intravenous contrast. Multiplanar CT image reconstructions and MIPs were obtained to evaluate the vascular anatomy. Carotid stenosis measurements (when applicable) are obtained utilizing NASCET criteria, using the distal internal carotid diameter as the denominator. Multiphase CT imaging of the brain was performed following IV bolus contrast injection. Subsequent parametric perfusion maps were calculated using RAPID software. RADIATION DOSE REDUCTION: This exam was performed according to the departmental dose-optimization program which includes automated exposure control, adjustment of the mA and/or kV according to patient size and/or use  of iterative reconstruction technique. CONTRAST:  OMNIPAQUE IOHEXOL 350 MG/ML SOLN COMPARISON:  Same day CT head. FINDINGS: CTA NECK FINDINGS Aortic arch: Extensive atherosclerosis of the aorta. Moderate stenosis of the right subclavian artery origin. 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 Right carotid system: Ulcerated atherosclerosis involving the carotid bifurcation and proximal ICA with approximately 40% stenosis. Left carotid system: Ulcerated atherosclerosis involving the carotid bifurcation and proximal ICA with approximately 40% stenosis. Vertebral arteries: Left dominant. Patent without greater than 50% stenosis. Skeleton: No acute abnormality on limited assessment. Other neck: No acute abnormality limits assessment. Approximately 2.2 cm left thyroid nodule. Upper chest: Visualized lung apices are clear.  Emphysema. Review of the MIP images confirms the above findings CTA HEAD FINDINGS Anterior circulation: Bilateral intracranial ICAs, MCAs, and ACAs are patent without proximal hemodynamically significant stenosis. 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 Posterior circulation: Bilateral intradural vertebral arteries, basilar artery and bilateral posterior cerebral arteries are patent without proximal hemodynamically significant  stenosis. Venous sinuses: As permitted by contrast timing, patent. Review of the MIP images confirms the above findings CT Brain Perfusion Findings: ASPECTS: 10 CBF (<30%) Volume: 0 mL Perfusion (Tmax>6.0s) volume: 8mL, likely artifactual given location in the inferior left frontal lobe at a site of streak artifact. Mismatch Volume: 8mL Infarction Location:None identified IMPRESSION: 1. No large vessel occlusion or proximal hemodynamically significant stenosis. 2. No convincing penumbra/mismatch or core infarct on CT perfusion. 3. 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 4.  Approximately 2.2 cm left thyroid nodule. Recommend thyroid US (ref: J Am Coll Radiol. 2015 Feb;12(2): 143-50). 5. Aortic Atherosclerosis (ICD10-I70.0) and Emphysema (ICD10-J43.9). Electronically Signed   By: Feliberto Harts M.D.   On: 11/27/2023 20:58   CT HEAD CODE STROKE WO CONTRAST Result Date: 11/27/2023 CLINICAL DATA:  Code stroke. Neuro deficit, left-sided facial droop and weakness. EXAM: CT HEAD WITHOUT CONTRAST TECHNIQUE: Contiguous axial images were obtained from the base of the skull through the vertex without intravenous contrast. RADIATION DOSE REDUCTION: This exam was performed according to the departmental dose-optimization program which includes automated exposure control, adjustment of the mA and/or kV according to patient size and/or use of iterative reconstruction technique. COMPARISON:  CT head 07/20/2014 FINDINGS: Brain: No acute intracranial hemorrhage. No CT evidence of acute infarct. Nonspecific hypoattenuation in the periventricular and subcortical white matter favored to reflect chronic microvascular ischemic changes. Remote infarct in the right corona radiata extending into the basal ganglia is new since 2015. Additional small remote lacunar infarct in the left thalamus. Similar appearance of calcified meningioma versus exostosis over the posterior right parietal lobe. No edema, mass effect, or midline shift. The basilar cisterns are patent. Ventricles: Prominence of the ventricles suggesting underlying parenchymal volume loss. Vascular: Atherosclerotic calcifications of the carotid siphons. No hyperdense vessel. Skull: No acute or aggressive finding. Orbits: Orbits are symmetric. Sinuses: The visualized paranasal sinuses are clear. Other: Mastoid air cells are clear. ASPECTS [facility] Stroke Program Early CT Score) - Ganglionic level infarction (caudate, lentiform nuclei, internal capsule, insula, M1-M3 cortex): 7 - Supraganglionic infarction (M4-M6 cortex): 3 Total score (0-10 with  10 being normal): 10 IMPRESSION: 1. No CT evidence of acute intracranial abnormality. 2. Moderate chronic microvascular ischemic changes and parenchymal volume loss. 3. Remote infarct in the right corona radiata extending into the basal ganglia is new since 2015. Additional remote lacunar infarct in the left thalamus is new since 2015. 4. ASPECTS is 10 These results were communicated to Dr. Scotty Court at 8:10 pm on 11/27/2023 by phone call. Electronically Signed   By: Emily Filbert M.D.   On: 11/27/2023 20:11     Data Reviewed: Relevant notes from primary care and specialist visits, past discharge summaries as available in EHR, including Care Everywhere. Prior diagnostic testing as pertinent to current admission diagnoses Updated medications and problem lists for reconciliation ED course, including vitals, labs, imaging, treatment and response to treatment Triage notes, nursing and pharmacy notes and ED provider's notes Notable results as noted in HPI   Assessment and Plan: * Acute right MCA stroke (HCC) Teleneuro Recommendations:         Neuro Checks       Bedside Swallow Eval       DVT Prophylaxis       IV Fluids, Normal Saline       Head of Bed 30 Degrees       Euglycemia and Avoid Hyperthermia (PRN Acetaminophen)       Initiate or continue Aspirin 81 MG daily  Antihypertensives PRN if Blood pressure is greater than 220/120 or there is a concern for End organ damage/contraindications for permissive HTN. If blood pressure is greater than 220/120 give labetalol PO or IV or Vasotec IV with a goal of 15% reduction in BP during the first 24 hours.  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  He does not give her the sertraline or lorazepam  Delirium precautions  Urinary incontinence Continue oxybutynin  Constipation Continue bisacodyl suppositories as needed     DVT prophylaxis: Lovenox  Consults: neurology  Advance Care Planning: DNR/DNI  Family Communication: Son, Otilio Carpen over the phone  Disposition Plan: Back to previous home environment  Severity of Illness: The appropriate patient status for this patient is OBSERVATION. Observation status is judged to be reasonable and necessary in order to provide the required intensity of service to ensure the patient's safety. The patient's presenting symptoms, physical exam findings, and initial radiographic and laboratory data in the context of their medical condition is felt to place them at decreased risk for further clinical deterioration. Furthermore, it is anticipated that the patient will be medically stable for discharge from the hospital within 2 midnights of admission.   Author: Andris Baumann, MD 11/27/2023 10:06 PM  For on call review [email or web address].

## 2023-11-27 NOTE — Assessment & Plan Note (Addendum)
 BP elevated with systolic to the 190s Not currently on any medication-Per son she has only had mild elevations Allow 24 hours of permissive hypertension and consider starting meds

## 2023-11-27 NOTE — Assessment & Plan Note (Signed)
 Continue bisacodyl suppositories as needed

## 2023-11-27 NOTE — Consult Note (Addendum)
 [facility] Tele[facility]urology Consult Services   Patient Name:   Carrie Singleton, Duchesneau Date of Birth:   1925/12/05 Identification Number:   MRN - [id number] Date of Service:   11/27/2023 19:50:25  Diagnosis:       I63.511 - Cerebrovascular accident (CVA) due to stenosis of right middle cerebral artery (HCCC)  Impression:      The patient is experiencing a right MCA stroke. Unfortunately, she is out of the time window for TNK. Will follow CTAs and perfusion to exclude LVO.  Recommend admission with stroke work up including MRI brain, 2D echo.  Recommend aspirin (may need to be given rectally if cannot swallow).  Recommend risk stratification labs with lipid profile, HbA1c. Recommend strict BP and sugar control. Recommend statin.  Needs inpatient Neurology follow up.  ADDENDUM: Recommend IR evaluation for aneurysm.  Our recommendations are outlined below.  Recommendations:        Stroke/Telemetry Floor       Neuro Checks       Bedside Swallow Eval       DVT Prophylaxis       IV Fluids, Normal Saline       Head of Bed 30 Degrees       Euglycemia and Avoid Hyperthermia (PRN Acetaminophen)       Initiate or continue Aspirin 81 MG daily       Antihypertensives PRN if Blood pressure is greater than 220/120 or there is a concern for End organ damage/contraindications for permissive HTN. If blood pressure is greater than 220/120 give labetalol PO or IV or Vasotec IV with a goal of 15% reduction in BP during the first 24 hours.  Sign Out:       Discussed with Emergency Department Provider    ------------------------------------------------------------------------------  Addendum: Advanced Imaging:  CTA Head and Neck Completed.  CTP Completed.  LVO: No  Small aneurysm is seen. Recommend IR evaluation.   Metrics: Last Known Well: 11/26/2023 22:00:00 Dispatch Time: 11/27/2023 19:50:25 Arrival Time: 11/27/2023 19:36:00 Initial Response Time: 11/27/2023  19:57:08 Symptoms: Left sided weakness. Initial patient interaction: 11/27/2023 20:00:44 NIHSS Assessment Completed: 11/27/2023 20:08:45 Patient is not a candidate for Thrombolytic. Thrombolytic Medical Decision: 11/27/2023 20:15:53 Patient was not deemed candidate for Thrombolytic because of following reasons: LKW outside 4.5 hr window. .  CT head showed no acute hemorrhage or acute core infarct.  Primary Provider Notified of Diagnostic Impression and Management Plan on: 11/27/2023 82:95:62    ------------------------------------------------------------------------------  History of Present Illness: Patient is a 89 year old Female.  Patient was brought by EMS for symptoms of Left sided weakness. 89 yo female with no known vascular risk factors who was brought in when her son found her at 5:30pm leaning to the left side, unable to move her left arm and unable to walk. He last saw her normal last night at 10pm. The patient is unable to tell when the symptoms started. She was admitted as a code stroke.    Past Medical History: Other PMH:  Carrie Singleton  Parkinson's disease  Medications:  No Anticoagulant use  No Antiplatelet use Reviewed EMR for current medications  Allergies:  Reviewed  Social History: Smoking: No Alcohol Use: Yes Drug Use: No  Family History:  There is no family history of premature cerebrovascular disease pertinent to this consultation  ROS : 14 Points Review of Systems was performed and was negative except mentioned in HPI.  Past Surgical History: There Is No Surgical History Contributory To Today's Visit  Examination: BP(158/102), Pulse(96), Blood Glucose(127) 1A: Level of Consciousness - Alert; keenly responsive + 0 1B: Ask Month and Age - 1 Question Right + 1 1C: Blink Eyes & Squeeze Hands - Performs Both Tasks + 0 2: Test Horizontal Extraocular Movements - Partial Gaze Palsy: Can Be Overcome + 1 3: Test Visual Fields  - No Visual Loss + 0 4: Test Facial Palsy (Use Grimace if Obtunded) - Normal symmetry + 0 5A: Test Left Arm Motor Drift - Drift, but doesn't hit bed + 1 5B: Test Right Arm Motor Drift - No Drift for 10 Seconds + 0 6A: Test Left Leg Motor Drift - Drift, but doesn't hit bed + 1 6B: Test Right Leg Motor Drift - No Drift for 5 Seconds + 0 7: Test Limb Ataxia (FNF/Heel-Shin) - No Ataxia + 0 8: Test Sensation - Mild-Moderate Loss: Less Sharp/More Dull + 1 9: Test Language/Aphasia - Normal; No aphasia + 0 10: Test Dysarthria - Normal + 0 11: Test Extinction/Inattention - Visual/tactile/auditory/spatial/personal inattention + 1  NIHSS Score: 6   Pre-Morbid Modified Rankin Scale: 0 Points = No symptoms at all  Spoke with : Dr Scotty Court  This consult was conducted in real time using interactive audio and Immunologist. Patient was informed of the technology being used for this visit and agreed to proceed. Patient located in hospital and provider located at home/office setting.   Patient is being evaluated for possible acute neurologic impairment and high probability of imminent or life-threatening deterioration. I spent total of 34 minutes providing care to this patient, including time for face to face visit via telemedicine, review of medical records, imaging studies and discussion of findings with providers, the patient and/or family.   Dr Hollace Hayward   [facility] For Inpatient follow-up with [facility] physician please call [facility] at [phone]. As we are not an outpatient service for any post hospital discharge needs please contact the hospital for assistance. If you have any questions for the [facility] physicians or need to reconsult for clinical or diagnostic changes please contact us via [facility] at [phone].

## 2023-11-27 NOTE — ED Notes (Signed)
 Pt assisted to bed pan to urinate. After bed pan pt had episode of incontinence to bladder.

## 2023-11-27 NOTE — Assessment & Plan Note (Signed)
 Continue Sinemet 25/100 3 times daily

## 2023-11-27 NOTE — IPAL (Signed)
  Interdisciplinary Goals of Care Family Meeting   Date carried out: 11/27/2023  Location of the meeting: Phone conference  Member's involved: Physician and Family Member or next of kin  Durable Power of Attorney or acting medical decision maker: son, Carrie Singleton    Discussion: We discussed goals of care for Carrie Singleton .   I have reviewed medical records including EPIC notes, labs and imaging, assessed the patient and then met with son to discuss major active diagnoses, plan of care, natural trajectory, prognosis, GOC, EOL wishes, disposition and options including Full code/DNI/DNR and the concept of comfort care if DNR is elected. Questions and concerns were addressed. They are  in agreement to continue current plan of care . Election for DNR/DNI status.   Code status:   Code Status: Limited: Do not attempt resuscitation (DNR) -DNR-LIMITED -Do Not Intubate/DNI    Disposition: Continue current acute care  Time spent for the meeting: 31    Andris Baumann, MD  11/27/2023, 10:46 PM

## 2023-11-27 NOTE — ED Notes (Signed)
 Patient transported to CT

## 2023-11-27 NOTE — ED Provider Notes (Signed)
 [facility] Provider Note    Event Date/Time   First MD Initiated Contact with Patient 11/27/23 1940     (approximate)   History   Chief Complaint: Code Stroke   HPI  Carrie Singleton is a 89 y.o. female with a history of Parkinson's disease, dementia who was brought to the ED due to left-sided weakness, noticed at 5:30 PM tonight by her son when he returned home from work.  Last known well was 10 PM yesterday evening.  He also noticed a left facial droop.  No trauma or fever.          Physical Exam   Triage Vital Signs: ED Triage Vitals [11/27/23 1945]  Encounter Vitals Group     BP (!) 158/102     Systolic BP Percentile      Diastolic BP Percentile      Pulse Rate 96     Resp      Temp      Temp src      SpO2 100 %     Weight      Height      Head Circumference      Peak Flow      Pain Score      Pain Loc      Pain Education      Exclude from Growth Chart     Most recent vital signs: Vitals:   11/27/23 2056 11/27/23 2140  BP:  (!) 188/75  Pulse:  83  Resp:  (!) 21  Temp: 99.3 F (37.4 C)   SpO2:  95%    General: Awake, no distress.  CV:  Good peripheral perfusion.  Regular rate and rhythm Resp:  Normal effort.  Clear to auscultation bilaterally Abd:  No distention.  Soft nontender Other:  Right gaze preference, left-sided visual neglect.  Able to move all extremities with some relative weakness of the left side.  No reaction to painful stimulus on the left..  Disoriented.  NIH stroke scale 15   ED Results / Procedures / Treatments   Labs (all labs ordered are listed, but only abnormal results are displayed) Labs Reviewed  CBC - Abnormal; Notable for the following components:      Result Value   WBC 10.9 (*)    All other components within normal limits  DIFFERENTIAL - Abnormal; Notable for the following components:   Neutro Abs 9.5 (*)    All other components within normal limits  COMPREHENSIVE METABOLIC PANEL WITH  GFR - Abnormal; Notable for the following components:   Glucose, Bld 117 (*)    Calcium 8.4 (*)    Albumin 3.3 (*)    All other components within normal limits  CBG MONITORING, ED - Abnormal; Notable for the following components:   Glucose-Capillary 127 (*)    All other components within normal limits  PROTIME-INR  APTT  URINE DRUG SCREEN, QUALITATIVE ([facility] ONLY)  URINALYSIS, ROUTINE W REFLEX MICROSCOPIC  ETHANOL     EKG Interpreted by me Sinus rhythm rate of 98.  Normal axis and intervals.  Right bundle branch block.  No acute ischemic changes.   RADIOLOGY CT head interpreted by me, no hemorrhage or mass.  Radiology report reviewed.  Discussed with radiologist.   PROCEDURES:  .Critical Care  Performed by: Sharman Cheek, MD Authorized by: Sharman Cheek, MD   Critical care provider statement:    Critical care time (minutes):  35   Critical care time was exclusive of:  Separately billable procedures and treating other patients   Critical care was necessary to treat or prevent imminent or life-threatening deterioration of the following conditions:  CNS failure or compromise   Critical care was time spent personally by me on the following activities:  Development of treatment plan with patient or surrogate, discussions with consultants, evaluation of patient's response to treatment, examination of patient, obtaining history from patient or surrogate, ordering and performing treatments and interventions, ordering and review of laboratory studies, ordering and review of radiographic studies, pulse oximetry, re-evaluation of patient's condition and review of old charts   Care discussed with: admitting provider      MEDICATIONS ORDERED IN ED: Medications  iohexol (OMNIPAQUE) 350 MG/ML injection 100 mL (100 mLs Intravenous Contrast Given 11/27/23 2037)  aspirin chewable tablet 324 mg (324 mg Oral Given 11/27/23 2123)     IMPRESSION / MDM / ASSESSMENT AND PLAN / ED COURSE   I reviewed the triage vital signs and the nursing notes.  DDx: Acute ischemic stroke, intracranial hemorrhage, intracranial mass, electrolyte derangement  Patient's presentation is most consistent with acute presentation with potential threat to life or bodily function.  Patient presents with left-sided weakness, right gaze preference concerning for a right MCA stroke.  Symptoms appear to be improving over the last few hours but NIH stroke scale is still high.  Presentation discussed with a neurologist.  Patient is outside of window for TNK.  CTA negative for LVO.  Case discussed with hospitalist for further stroke care.  Patient passed swallow screen and took p.o. aspirin.       FINAL CLINICAL IMPRESSION(S) / ED DIAGNOSES   Final diagnoses:  Cerebrovascular accident (CVA), unspecified mechanism (HCC)  Chronic dementia (HCC)     Rx / DC Orders   ED Discharge Orders     None        Note:  This document was prepared using Dragon voice recognition software and may include unintentional dictation errors.   Sharman Cheek, MD 11/27/23 [phone]

## 2023-11-27 NOTE — ED Triage Notes (Signed)
 To ER from home via EMS for possible stroke. LKW 1730 and son noticed a left sided facila droop, left sided weakness, right sided gaze. LVO for EMS was 1. Resolve in the left sided weakness. No thinners.

## 2023-11-27 NOTE — Assessment & Plan Note (Signed)
 Continue oxybutynin.

## 2023-11-27 NOTE — Assessment & Plan Note (Addendum)
 Leukocytosis Patient's son reports increased daytime somnolence past couple of months, WBC 10,900, following admission had low-grade temp of 99.3 Will check urinalysis to eval for UTI CXR  Nasal swab to evaluate for COVID flu and RSV

## 2023-11-27 NOTE — ED Notes (Signed)
 New last known well after Neurologist discussed with family is 2200 on 11/26/2023.

## 2023-11-27 NOTE — Assessment & Plan Note (Addendum)
 History of Carrie Singleton in [place] Syndrome/perceptual distortion Last seizure was 10 years ago Continue Keppra ER 750 mg daily

## 2023-11-27 NOTE — ED Notes (Signed)
 Blood glucose of 127.

## 2023-11-27 NOTE — Progress Notes (Signed)
 Code stroke activated at 1948. Patient to CT at 1949 and TS paged at 1950. Dr. Posey Rea connected at [phone].   No TNK, LKW changed to 2200 last night once son arrived and clarified story.  MRS 3.   Back to CT for advanced imaging at 2019.  No LVO per rad report. Communicated to Dr. Posey Rea at 2113.   Dorathy Daft, Telestroke RN

## 2023-11-27 NOTE — Assessment & Plan Note (Addendum)
 Cognitive deficits Son states he does not think his mother is depressed.  He does not give her the sertraline or lorazepam  Delirium precautions

## 2023-11-28 ENCOUNTER — Observation Stay
Admit: 2023-11-28 | Discharge: 2023-11-28 | Disposition: A | Discharge status: Home / Self Care | Attending: Internal Medicine | Admitting: Internal Medicine

## 2023-11-28 ENCOUNTER — Observation Stay

## 2023-11-28 ENCOUNTER — Encounter: Payer: Self-pay | Admitting: Internal Medicine

## 2023-11-28 DIAGNOSIS — I63511 Cerebral infarction due to unspecified occlusion or stenosis of right middle cerebral artery: Principal | ICD-10-CM

## 2023-11-28 LAB — ECHOCARDIOGRAM COMPLETE
AR max vel: 2.23 cm2
AV Area VTI: 2.09 cm2
AV Area mean vel: 1.92 cm2
AV Mean grad: 3 mmHg
AV Peak grad: 4.8 mmHg
Ao pk vel: 1.09 m/s
Area-P 1/2: 2.94 cm2
Height: 61 in
MV VTI: 2.09 cm2
S' Lateral: 2.7 cm
Weight: 1894.19 [oz_av]

## 2023-11-28 LAB — LIPID PANEL
Cholesterol: 217 mg/dL — ABNORMAL HIGH (ref 0–200)
Cholesterol: 210 mg/dL — ABNORMAL HIGH (ref 0–200)
HDL: 51 mg/dL (ref >40)
HDL: 46 mg/dL (ref >40)
LDL Cholesterol: 147 mg/dL — ABNORMAL HIGH (ref 0–99)
LDL Cholesterol: 136 mg/dL — ABNORMAL HIGH (ref 0–99)
Total CHOL/HDL Ratio: 4.3 ratio
Total CHOL/HDL Ratio: 4.6 ratio
Triglycerides: 95 mg/dL (ref <150)
Triglycerides: 140 mg/dL (ref <150)
VLDL: 19 mg/dL (ref 0–40)
VLDL: 28 mg/dL (ref 0–40)

## 2023-11-28 LAB — HEMOGLOBIN A1C
Hgb A1c MFr Bld: 5.1 % (ref 4.8–5.6)
Mean Plasma Glucose: 99.67 mg/dL

## 2023-11-28 LAB — RESP PANEL BY RT-PCR (RSV, FLU A&B, COVID)  RVPGX2
Influenza A by PCR: NEGATIVE
Influenza B by PCR: NEGATIVE
Resp Syncytial Virus by PCR: NEGATIVE
SARS Coronavirus 2 by RT PCR: NEGATIVE

## 2023-11-28 MED ORDER — LEVETIRACETAM ER 500 MG PO TB24
1500.0000 mg | ORAL_TABLET | Freq: Every day | ORAL | Status: DC
  Administered 2023-11-28: 1500 mg via ORAL
  Filled 2023-11-28 (×2): qty 3

## 2023-11-28 NOTE — Evaluation (Signed)
 Occupational Therapy Evaluation Patient Details Name: Carrie Singleton MRN: [id number] DOB: Jan 02, 1926 Today's Date: 11/28/2023   History of Present Illness   Pt is a 89 y.o. female that presented to ED when her son noticed L sided deficits. Past medical history significant for Seizure disorder with [name], on Keppra, anxiety and depression, Parkinson's disease, dementia, urinary incontinence, dysphagia, relocated to [place] from [place] June 2024 to live with her son, also receiving home hospice.     Clinical Impressions Patient presenting with decreased Ind in self care,balance, functional mobility/transfers, endurance, and safety awareness. Patient reports living at home with son. Per chart, she utilizes rollator for mobility and has hospice aide that comes 3x/wk for bathing. Pt is pleasant and cooperative during session. She requires CGA- min A for self care and mobility with RW this session.  Patient will benefit from acute OT to increase overall independence in the areas of ADLs, functional mobility, and safety awareness in order to safely discharge.     If plan is discharge home, recommend the following:   A little help with walking and/or transfers;A little help with bathing/dressing/bathroom;Assistance with cooking/housework;Assist for transportation;Supervision due to cognitive status;Direct supervision/assist for financial management;Direct supervision/assist for medications management;Help with stairs or ramp for entrance     Functional Status Assessment   Patient has had a recent decline in their functional status and demonstrates the ability to make significant improvements in function in a reasonable and predictable amount of time.     Equipment Recommendations   None recommended by OT      Precautions/Restrictions   Precautions Precautions: Fall     Mobility Bed Mobility Overal bed mobility: Needs Assistance Bed Mobility: Supine to  Sit, Sit to Supine     Supine to sit: Contact guard, HOB elevated, Used rails Sit to supine: Contact guard assist        Transfers Overall transfer level: Needs assistance Equipment used: Rolling walker (2 wheels) Transfers: Sit to/from Stand, Bed to chair/wheelchair/BSC Sit to Stand: Contact guard assist, Min assist     Step pivot transfers: Contact guard assist            Balance Overall balance assessment: Needs assistance Sitting-balance support: Feet supported Sitting balance-Leahy Scale: Good     Standing balance support: During functional activity, Bilateral upper extremity supported, Reliant on assistive device for balance Standing balance-Leahy Scale: Poor                             ADL either performed or assessed with clinical judgement   ADL Overall ADL's : Needs assistance/impaired                                       General ADL Comments: CGA for mobility and functional transfers with use of RW.     Vision Baseline Vision/History: 1 Wears glasses Patient Visual Report: No change from baseline              Pertinent Vitals/Pain Pain Assessment Pain Assessment: No/denies pain     Extremity/Trunk Assessment Upper Extremity Assessment Upper Extremity Assessment: Generalized weakness   Lower Extremity Assessment Lower Extremity Assessment: Generalized weakness       Communication Communication Communication: Impaired Factors Affecting Communication: Hearing impaired   Cognition Arousal: Alert Behavior During Therapy: WFL for tasks assessed/performed Cognition: No apparent impairments  OT - Cognition Comments: Pt is oriented to self and location. Very pleasant and coopertive throughout.                 Following commands: Intact       Cueing  General Comments   Cueing Techniques: Verbal cues;Tactile cues;Visual cues              Home Living Family/patient expects to be  discharged to:: Private residence Living Arrangements: Children Available Help at Discharge: Family;Available PRN/intermittently                             Additional Comments: per chart review lives with son, ambulatory with her rollator      Prior Functioning/Environment Prior Level of Function : Patient poor historian/Family not available               ADLs Comments: Per chart, pt has hospice aide that comes 3x/wk to assist with ADLs. She is able to self feed and ambulate with rollator.    OT Problem List: Decreased strength;Decreased activity tolerance;Decreased safety awareness;Impaired balance (sitting and/or standing);Decreased knowledge of use of DME or AE   OT Treatment/Interventions: Self-care/ADL training;Therapeutic exercise;Therapeutic activities;Energy conservation;DME and/or AE instruction;Patient/family education;Balance training      OT Goals(Current goals can be found in the care plan section)   Acute Rehab OT Goals Patient Stated Goal: to go home OT Goal Formulation: With patient Time For Goal Achievement: 12/12/23 Potential to Achieve Goals: Fair ADL Goals Pt Will Perform Grooming: with modified independence;standing Pt Will Perform Lower Body Dressing: with supervision;sit to/from stand Pt Will Transfer to Toilet: with supervision;ambulating Pt Will Perform Toileting - Clothing Manipulation and hygiene: with supervision;sit to/from stand   OT Frequency:  Min 1X/week       AM-PAC OT "6 Clicks" Daily Activity     Outcome Measure Help from another person eating meals?: None Help from another person taking care of personal grooming?: None Help from another person toileting, which includes using toliet, bedpan, or urinal?: A Little Help from another person bathing (including washing, rinsing, drying)?: A Little Help from another person to put on and taking off regular upper body clothing?: A Little Help from another person to put on and  taking off regular lower body clothing?: A Little 6 Click Score: 20   End of Session Equipment Utilized During Treatment: Rolling walker (2 wheels) Nurse Communication: Mobility status  Activity Tolerance: Patient tolerated treatment well Patient left: in bed;with call bell/phone within reach;with bed alarm set;with nursing/sitter in room  OT Visit Diagnosis: Unsteadiness on feet (R26.81);Muscle weakness (generalized) (M62.81);History of falling (Z91.81)                Time: 4098-1191 OT Time Calculation (min): 16 min Charges:  OT General Charges $OT Visit: 1 Visit OT Evaluation $OT Eval Low Complexity: 1 Low  Jackquline Denmark, MS, OTR/L , CBIS ascom [phone]  11/28/23, 2:41 PM

## 2023-11-28 NOTE — Care Management Obs Status (Signed)
 MEDICARE OBSERVATION STATUS NOTIFICATION   Patient Details  Name: Carrie Singleton MRN: [id number] Date of Birth: 23-Jun-1926   Medicare Observation Status Notification Given:  Yes  Reviewed with son at bedside. He declined a copy.   Yisrael Obryan E Mariano Doshi, LCSW 11/28/2023, 3:29 PM

## 2023-11-28 NOTE — Progress Notes (Addendum)
 Progress Note   Patient: Carrie Singleton WGN:[id number] DOB: 07-04-1926 DOA: 11/27/2023     0 DOS: the patient was seen and examined on 11/28/2023   Brief hospital course: No notes on file  Assessment and Plan: * Acute right MCA stroke (HCC) Teleneuro Recommendations:         Neuro Checks       Bedside Swallow Eval       DVT Prophylaxis       IV Fluids, Normal Saline       Head of Bed 30 Degrees       Euglycemia and Avoid Hyperthermia (PRN Acetaminophen)       Initiate or continue Aspirin 81 MG daily       Antihypertensives PRN if Blood pressure is greater than 220/120 or there is a concern for End organ damage/contraindications for permissive HTN. If blood pressure is greater than 220/120 give labetalol PO or IV or Vasotec IV with a goal of 15% reduction in BP during the first 24 hours. - Teleneuro based on the presence of suspected acute MCA stroke. - However MRI of the brain is negative for any acute or subacute stroke at this time. - Patient's left-sided weakness has improved. -Ordered echocardiogram, carotid Doppler, PT OT  Neurology consult to follow  Somnolence, daytime Leukocytosis Patient's son reports increased daytime somnolence past couple of months, WBC 10,900, following admission had low-grade temp of 99.3 Will check urinalysis to eval for UTI, negative CXR no acute abnormality Nasal swab to evaluate for COVID flu and RSV were negative Continue to monitor CBC if trending up check procalcitonin, consider empiric antibiotic therapy.  Partial epilepsy with impairment of consciousness (HCC) History of [place] Syndrome/perceptual distortion Last seizure was 10 years ago Continue Keppra ER 750 mg daily  Parkinson disease (HCC) Continue Sinemet 25/100 3 times daily  HTN (hypertension) BP elevated with systolic to the 190s Not currently on any medication-Per son she has only had mild elevations Allow 24 hours of permissive hypertension and consider starting  meds  Anxiety and depression Cognitive deficits Son states he does not think his mother is depressed.  He does not give her the sertraline or lorazepam  Delirium precautions  Urinary incontinence Continue oxybutynin  Constipation Continue bisacodyl suppositories as needed        Subjective: Patient was seen and examined at bedside today.  Patient has no new complaints.  Appears to be more alert.  Physical Exam: Vitals:   11/28/23 0603 11/28/23 0952 11/28/23 1204 11/28/23 1326  BP: (!) 132/55 (!) 137/57 (!) 103/50 (!) 110/52  Pulse: 60 66 68 64  Resp: 17 16 17 18   Temp: 98.6 F (37 C) 98.5 F (36.9 C) 98 F (36.7 C) 98.2 F (36.8 C)  TempSrc: Oral Oral Oral Oral  SpO2: 97% 97% 98% 100%  Weight:      Height:       Constitutional:      General: She is not in acute distress.    Comments: Frail-appearing elderly female  HENT:     Head: Normocephalic and atraumatic.  Cardiovascular:     Rate and Rhythm: Normal rate and regular rhythm.     Heart sounds: Normal heart sounds.  Pulmonary:     Effort: Pulmonary effort is normal.     Breath sounds: Normal breath sounds.  Abdominal:     Palpations: Abdomen is soft.     Tenderness: There is no abdominal tenderness.  Neurological:     Mental Status:  Mental status is at baseline.    Data Reviewed:  Reviewed CBC, CMP  Family Communication: Spoke to patient's son  Disposition: Status is: Observation The patient will require care spanning > 2 midnights and should be moved to inpatient because: Acute MCA stroke  Planned Discharge Destination: Home with Home Health    Time spent: 35 minutes  Author: Harold Hedge, MD 11/28/2023 3:42 PM  For on call review [email or web address].

## 2023-11-28 NOTE — TOC Initial Note (Signed)
 Transition of Care [facility]) - Initial/Assessment Note    Patient Details  Name: Carrie Singleton MRN: [id number] Date of Birth: [birth year]  Transition of [facility]) CM/SW Contact:    Liliana Cline, LCSW Phone Number: 11/28/2023, 3:59 PM  Clinical Narrative:                 CSW met with son [name] at bedside. Patient is from home with son. He states she is active with [facility] and an aide comes out 3 times per week. Patient has a walker and raised toilet seat at home. Son provides transportation. Patient gets most medications from Hospice, local pharmacy of choice is [facility] [place]. [name] states they do not want HH or SNF, they want to resume hospice care at discharge. CSW called [facility] - spoke with Receptionist Shauna, she will have the nurse call CSW back to determine what is needed to resume their services at discharge.  Expected Discharge Plan: Home w Hospice Care Barriers to Discharge: Continued Medical Work up   Patient Goals and CMS Choice Patient states their goals for this hospitalization and ongoing recovery are:: home with continued hospice care CMS Medicare.gov Compare Post Acute Care list provided to:: Patient Represenative (must comment) Choice offered to / list presented to : Adult Children      Expected Discharge Plan and Services       Living arrangements for the past 2 months: Single Family Home                                      Prior Living Arrangements/Services Living arrangements for the past 2 months: Single Family Home Lives with:: Adult Children Patient language and need for interpreter reviewed:: Yes Do you feel safe going back to the place where you live?: Yes      Need for Family Participation in Patient Care: Yes (Comment) Care giver support system in place?: Yes (comment) Current home services: DME, Hospice Criminal Activity/Legal Involvement Pertinent to Current Situation/Hospitalization: No - Comment as needed  Activities of  Daily Living      Permission Sought/Granted Permission sought to share information with : Facility Industrial/product designer granted to share information with : Yes, Verbal Permission Granted (by son [name])     Permission granted to share info w AGENCY: [facility]        Emotional Assessment       Orientation: : Fluctuating Orientation (Suspected and/or reported Sundowners) Alcohol / Substance Use: Not Applicable Psych Involvement: No (comment)  Admission diagnosis:  Acute right MCA stroke (HCC) [I63.511] Chronic dementia (HCC) [F03.90] Cerebrovascular accident (CVA), unspecified mechanism (HCC) [I63.9] Patient Active Problem List   Diagnosis Date Noted   Parkinson disease (HCC) 11/27/2023   Acute right MCA stroke (HCC) 11/27/2023   Urinary incontinence 11/27/2023   Anxiety and depression 11/27/2023   HTN (hypertension) 11/27/2023   Constipation 11/27/2023   Somnolence, daytime 11/27/2023   Partial epilepsy with impairment of consciousness (HCC) 10/09/2006   PCP:  Pcp, No Pharmacy:  No Pharmacies Listed    Social Drivers of Health (SDOH) Social History: SDOH Screenings   Food Insecurity: No Food Insecurity (04/21/2023)   Received from [facility]  Housing: Low Risk  (04/21/2023)   Received from [facility]  Transportation Needs: Unmet Transportation Needs (04/21/2023)   Received from [facility]  Utilities: Not At Risk (04/21/2023)  Received from [facility]  Financial Resource Strain: Low Risk  (04/21/2023)   Received from [facility]  Tobacco Use: Low Risk  (04/21/2023)   Received from [facility]   SDOH Interventions:     Readmission Risk Interventions     No data to display

## 2023-11-28 NOTE — Progress Notes (Signed)
*  PRELIMINARY RESULTS* Echocardiogram 2D Echocardiogram has been performed.  Carrie Singleton 11/28/2023, 4:12 PM

## 2023-11-28 NOTE — Evaluation (Addendum)
 Clinical/Bedside Swallow Evaluation Patient Details  Name: Carrie Singleton MRN: [id number] Date of Birth: October 11, 1925  Today's Date: 11/28/2023 Time: SLP Start Time (ACUTE ONLY): 0810 SLP Stop Time (ACUTE ONLY): 0905 SLP Time Calculation (min) (ACUTE ONLY): 55 min  Past Medical History: No past medical history on file. Past Surgical History: The histories are not reviewed yet. Please review them in the "History" navigator section and refresh this SmartLink. HPI:  Pt is a 89 y.o. 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  She is being admitted with concern for stroke, left sided weakness.  Last known well was at 10 PM on 3/26 and son returned home from work at 5:30 PM on 3/627 to find her with left-sided weakness and a left facial droop. Son said that in the past Two months she has been sleeping a lot during the day, and he believes she has not been taking her medication as she should.    CXR: No acute abnormality seen.   MRI: No evidence of acute intracranial abnormality.  2. Moderate chronic microvascular ischemic disease.  3. Remote right cerebellar and basal ganglia infarcts.  4. Approximately 1 cm probable meningioma along the right parietal  convexity. No mass effect.     Assessment / Plan / Recommendation  Clinical Impression   Pt seen for BSE this morning. Pt awake, verbal and engaged in basic conversation re: self and family w/ this SLP, NSG. She followed 1-step commands to help sit herself upright in bed. She tended to curl up to lie on her Right side in bed.  Pt on RA, afebrile. WBC 10.9.   Pt appears to present w/ mild+  oropharyngeal phase dysphagia in setting of Baseline Dementia, Cognitive decline, as well as Missing Lower Denture plate for effective mastication. Son reported pt has been "sleeping more during the day". Pt also has a Baseline dx of Parkinson's Dis per chart. ANY Cognitive decline can impact her overall awareness/timing of swallow and safety during po tasks which increases risk for aspiration, choking. Pt's risk for aspiration can be reduced when following general aspiration precautions, using a modified diet consistency of broken-down foods d/t Dentition status, and use of Nectar consistency liquids for their viscosity and pt's control when swallowing. She also benefits from Supervision during po tasks and self-feeding d/t generalized weakness, hospitalization, and advanced Age(89y).        Pt consumed several trials of ice chips, purees, minced/soft solids and Nectar liquids via cup/straw w/ No overt clinical s/s of aspiration noted: no decline in vocal quality, no cough, and no decline in respiratory status during/post trials. O2 sats 99%. HOWEVER, w/ trials of thin liquids via cup and straw, pt exhibited inconsistent Coughing despite following aspiration precautions/Supervision/support. Pt endorsed she "coughed when drinking" at home. Oral phase was adequate for bolus management and oral clearing of the boluses given. Mastication of softened solids was adequate given Time and moistening the foods broken small. Pt attempted self-feeding but required min-mod support and guidance d/t weakness. She was able to feed self some and hold Cup to drink which improves safety of swallowing.  OM Exam appeared grossly [facility] w/ No overt/significant unilateral weakness noted. Cues were  helpful during OM tasks.          In setting of baseline Dementia/Cognitive decline, advanced Age, and Lacking Lower Dentition, as well as the overt s/s of aspiration w/ thin liquids, recommend initiation of the dysphagia level 3(MEATS MINCED  w/ gravies to moisten well for ease of oral phase/mastication; Nectar  liquids. Recommend general aspiration precautions; reduce Distractions/Talking during meals and engage pt during meals for self-feeding. Small bites/sips Slowly and clear mouth b/t bites. Support feeding at meals as needed.Rest Breaks as needed for conservation of energy. Easy to eat foods. Pills Whole vs Crushed in Puree for safer swallowing.  MD/NSG updated.  ST services recommends follow w/ Palliative Care/Hospice (in the home) for QOL GOC discussion and education re: impact of Cognitive decline/Dementia and Parkinson's Dis. on swallowing and oral intake overall. Pt also has Baseline Dysphagia. Suspect pt could be close to/at her baseline -- will attempt to talk w/ Son for further. Precautions posted in room, chart. If any decline in her cognitive-communication functioning (post Acuity of illness) is noted at her next venue of care, then following MD can order for cognitive therapy then to address any needs/education. SLP Visit Diagnosis: Dysphagia, oropharyngeal phase (R13.12) (baseline Dysphagia per chart; Missing Lower Denture plate. Baseline Dementia/Cognitive decline and Parkinson's Dis.)    Aspiration Risk  Mild aspiration risk;Risk for inadequate nutrition/hydration    Diet Recommendation   Nectar;Dysphagia 3 (mechanical soft) (meats Minced w/ gravies) = dysphagia level 3(MEATS MINCED w/ gravies to moisten well for ease of oral phase/mastication; Nectar  liquids. Recommend general aspiration precautions; reduce Distractions/Talking during meals and engage pt during meals for self-feeding. Small bites/sips Slowly and clear mouth b/t bites. Support feeding at meals as needed.Rest Breaks as needed for conservation of energy. Easy to eat foods.   Medication Administration: Crushed with puree (vs Whole if able (in puree))    Other  Recommendations Recommended Consults:  (Palliative Care f/u for GOC; Dietician) Oral Care  Recommendations: Oral care BID;Oral care before and after PO;Staff/trained caregiver to provide oral care (Denture care) Caregiver Recommendations: Avoid jello, ice cream, thin soups, popsicles;Remove water pitcher;Have oral suction available    Recommendations for follow up therapy are one component of a multi-disciplinary discharge planning process, led by the attending physician.  Recommendations may be updated based on patient status, additional functional criteria and insurance authorization.  Follow up Recommendations Follow physician's recommendations for discharge plan and follow up therapies (at nex venue of care if needs indicate)      Assistance Recommended at Discharge  FULL d/t Dementia  Functional Status Assessment Patient has had a recent decline in their functional status and/or demonstrates limited ability to make significant improvements in function in a reasonable and predictable amount of time  Frequency and Duration min 1 x/week  1 week       Prognosis Prognosis for improved oropharyngeal function: Fair Barriers to Reach Goals: Cognitive deficits;Time post onset;Severity of deficits Barriers/Prognosis Comment: baseline Dysphagia per chart; Missing Lower Denture plate. Baseline Dementia/Cognitive decline and Parkinson's Dis.      Swallow Study   General Date of Onset: 11/27/23 HPI: Pt is a 89 y.o. female with medical history significant for Dementia, Seizure Disorder with [place] in [place] syndrome, on Keppra, anxiety and depression, Parkinson's disease, urinary incontinence, dysphagia, relocated to [place] from [place] last June 2024 to be closer to her Son, after suffering a fall while living independently, She is currently on Hospice services w/ aides coming to the home to assist  w/ ADLs.  She is being admitted with concern for stroke, left sided weakness.  Last known well was at 10 PM on 3/26 and son returned home from work at 5:30 PM on 3/627 to find her with  left-sided weakness and a left facial droop. Son said that in the past Two months she has been sleeping a lot during the day, and he believes she has not been taking her medication as she should.   CXR: No acute abnormality seen.  MRI: No evidence of acute intracranial abnormality.  2. Moderate chronic microvascular ischemic disease.  3. Remote right cerebellar and basal ganglia infarcts.  4. Approximately 1 cm probable meningioma along the right parietal  convexity. No mass effect. Type of Study: Bedside Swallow Evaluation Previous Swallow Assessment: unknown Diet Prior to this Study: NPO Temperature Spikes Noted: No (wbc 10.9) Respiratory Status: Room air History of Recent Intubation: No Behavior/Cognition: Alert;Cooperative;Pleasant mood;Confused;Distractible;Requires cueing (Baseline Dementia) Oral Cavity Assessment: Within Functional Limits;Dry (slight) Oral Care Completed by SLP: Yes Oral Cavity - Dentition: Dentures, top (Missing Bottom Denture plate) Vision: Functional for self-feeding Self-Feeding Abilities: Able to feed self;Needs assist;Needs set up Patient Positioning: Upright in bed (MOD+ assist) Baseline Vocal Quality: Normal;Low vocal intensity (slight) Volitional Cough: Strong Volitional Swallow: Able to elicit    Oral/Motor/Sensory Function Overall Oral Motor/Sensory Function: Within functional limits (overall - no gross Left sided oral weakness assessed)   Ice Chips Ice chips: Within functional limits Presentation: Spoon (fed; 5 trials)   Thin Liquid Thin Liquid: Impaired (intermittent) Presentation: Cup;Self Fed;Straw (5 trials via each method) Oral Phase Impairments:  (wfl) Oral Phase Functional Implications:  (anterior spillage 1x) Pharyngeal  Phase Impairments: Suspected delayed Swallow;Cough - Immediate (x1-2 w/ each method)    Nectar Thick Nectar Thick Liquid: Within functional limits Presentation: Cup;Self Fed;Straw (~ 3-4 ozs total)   Honey Thick Honey Thick  Liquid: Not tested   Puree Puree: Within functional limits Presentation: Spoon;Self Fed (feeding supported; ~3 ozs)   Solid     Solid: Impaired (min) Presentation:  (feeding supported; 6 trials) Oral Phase Impairments: Impaired mastication (lacking bottom dentition) Oral Phase Functional Implications:  (prolonged time) Pharyngeal Phase Impairments:  (none) Other Comments: moistened foods well        Jerilynn Som, MS, CCC-SLP Speech Language Pathologist Rehab Services; [facility] [phone] (ascom) Darcel Frane 11/28/2023,3:52 PM

## 2023-11-28 NOTE — Evaluation (Signed)
 Physical Therapy Evaluation Patient Details Name: Carrie Singleton MRN: [id number] DOB: 09/23/1925 Today's Date: 11/28/2023  History of Present Illness  Pt is a 89 y.o. female that presented to ED when her son noticed L sided deficits. Past medical history significant for Seizure disorder with [name], on Keppra, anxiety and depression, Parkinson's disease, dementia, urinary incontinence, dysphagia, relocated to [place] from [place] June 2024 to live with her son, also receiving home hospice.   Clinical Impression  Patient alert, agreeable to PT, oriented to self only. Per chart review, the son stated she is ambulatory with her rollator, has a home hospice aide 3 days a week, able to dress modI. CGA for bed mobility and use of bed rails, extra time. Sit <> Stand CGA/minA, did require steadying assist and RW stabilization (pt used to rollator). Stand pivot to [facility], pericare in sitting with supervision. She ambulated ~39ft with CGA and RW, effortful and very decreased gait velocity, but no LOB (minA intermittently for RW positioning/management). Returned to bed with needs in reach. Currently recommend hands on assist for all mobility.  Overall the patient demonstrated deficits (see "PT Problem List") that impede the patient's functional abilities, safety, and mobility and would benefit from skilled PT intervention.          If plan is discharge home, recommend the following: A little help with walking and/or transfers;A little help with bathing/dressing/bathroom;Assistance with cooking/housework;Supervision due to cognitive status;Assist for transportation;Help with stairs or ramp for entrance;Direct supervision/assist for medications management   Can travel by private vehicle        Equipment Recommendations Wheelchair (measurements PT);Wheelchair cushion (measurements PT)  Recommendations for Other Services       Functional Status Assessment Patient has had a recent  decline in their functional status and demonstrates the ability to make significant improvements in function in a reasonable and predictable amount of time.     Precautions / Restrictions Precautions Precautions: Fall Recall of Precautions/Restrictions: Impaired Restrictions Weight Bearing Restrictions Per Provider Order: No      Mobility  Bed Mobility Overal bed mobility: Needs Assistance Bed Mobility: Supine to Sit, Sit to Supine     Supine to sit: Contact guard, HOB elevated, Used rails Sit to supine: Contact guard assist        Transfers Overall transfer level: Needs assistance Equipment used: Rolling walker (2 wheels) Transfers: Sit to/from Stand Sit to Stand: Contact guard assist, Min assist           General transfer comment: steadying assist needed from [facility], and stabilization of RW (pt used to rollator)    Ambulation/Gait Ambulation/Gait assistance: Contact guard assist, Min assist Gait Distance (Feet): 45 Feet Assistive device: Rolling walker (2 wheels)         General Gait Details: slow, shuffled step, assist with RW needed  Stairs            Wheelchair Mobility     Tilt Bed    Modified Rankin (Stroke Patients Only)       Balance Overall balance assessment: Needs assistance Sitting-balance support: Feet supported Sitting balance-Leahy Scale: Good     Standing balance support: During functional activity, Bilateral upper extremity supported Standing balance-Leahy Scale: Poor                               Pertinent Vitals/Pain Pain Assessment Pain Assessment: No/denies pain    Home Living Family/patient expects to be  discharged to:: Private residence Living Arrangements: Children                 Additional Comments: per chart review lives with son, ambulatory with her rollator    Prior Function Prior Level of Function : Patient poor historian/Family not available                      Extremity/Trunk Assessment   Upper Extremity Assessment Upper Extremity Assessment: Defer to OT evaluation    Lower Extremity Assessment Lower Extremity Assessment: Generalized weakness (able to lift and move symmetrically against gravity)       Communication        Cognition Arousal: Alert Behavior During Therapy: WFL for tasks assessed/performed   PT - Cognitive impairments: History of cognitive impairments                       PT - Cognition Comments: pt oriented to self only Following commands: Intact       Cueing Cueing Techniques: Verbal cues     General Comments      Exercises     Assessment/Plan    PT Assessment Patient needs continued PT services  PT Problem List Decreased strength;Decreased activity tolerance;Decreased balance;Decreased mobility       PT Treatment Interventions DME instruction;Balance training;Gait training;Neuromuscular re-education;Stair training;Functional mobility training;Patient/family education;Therapeutic activities;Therapeutic exercise    PT Goals (Current goals can be found in the Care Plan section)  Acute Rehab PT Goals PT Goal Formulation: Patient unable to participate in goal setting Time For Goal Achievement: 12/12/23 Potential to Achieve Goals: Fair    Frequency Min 2X/week     Co-evaluation               AM-PAC PT "6 Clicks" Mobility  Outcome Measure Help needed turning from your back to your side while in a flat bed without using bedrails?: A Little Help needed moving from lying on your back to sitting on the side of a flat bed without using bedrails?: A Little Help needed moving to and from a bed to a chair (including a wheelchair)?: A Little Help needed standing up from a chair using your arms (e.g., wheelchair or bedside chair)?: A Little Help needed to walk in hospital room?: A Little Help needed climbing 3-5 steps with a railing? : A Little 6 Click Score: 18    End of Session  Equipment Utilized During Treatment: Gait belt Activity Tolerance: Patient tolerated treatment well Patient left: in bed;with call bell/phone within reach;with bed alarm set Nurse Communication: Mobility status PT Visit Diagnosis: Other abnormalities of gait and mobility (R26.89);Difficulty in walking, not elsewhere classified (R26.2);Muscle weakness (generalized) (M62.81)    Time: 7846-9629 PT Time Calculation (min) (ACUTE ONLY): 20 min   Charges:   PT Evaluation $PT Eval Low Complexity: 1 Low PT Treatments $Therapeutic Activity: 8-22 mins PT General Charges $$ ACUTE PT VISIT: 1 Visit         Olga Coaster PT, DPT 12:58 PM,11/28/23

## 2023-11-29 ENCOUNTER — Observation Stay (HOSPITAL_BASED_OUTPATIENT_CLINIC_OR_DEPARTMENT_OTHER)
Admit: 2023-11-29 | Discharge: 2023-11-29 | Disposition: A | Discharge status: Home / Self Care | Attending: Internal Medicine

## 2023-11-29 DIAGNOSIS — G459 Transient cerebral ischemic attack, unspecified: Secondary | ICD-10-CM | POA: Diagnosis not present

## 2023-11-29 DIAGNOSIS — R2990 Unspecified symptoms and signs involving the nervous system: Secondary | ICD-10-CM

## 2023-11-29 LAB — CBC WITH DIFFERENTIAL/PLATELET
Abs Immature Granulocytes: 0.04 10*3/uL (ref 0.00–0.07)
Basophils Absolute: 0 10*3/uL (ref 0.0–0.1)
Basophils Relative: 0 %
Eosinophils Absolute: 0.2 10*3/uL (ref 0.0–0.5)
Eosinophils Relative: 2 %
HCT: 32.2 % — ABNORMAL LOW (ref 36.0–46.0)
Hemoglobin: 10.6 g/dL — ABNORMAL LOW (ref 12.0–15.0)
Immature Granulocytes: 1 %
Lymphocytes Relative: 25 %
Lymphs Abs: 2 10*3/uL (ref 0.7–4.0)
MCH: 31.3 pg (ref 26.0–34.0)
MCHC: 32.9 g/dL (ref 30.0–36.0)
MCV: 95 fL (ref 80.0–100.0)
Monocytes Absolute: 0.5 10*3/uL (ref 0.1–1.0)
Monocytes Relative: 6 %
Neutro Abs: 5.3 10*3/uL (ref 1.7–7.7)
Neutrophils Relative %: 66 %
Platelets: 200 10*3/uL (ref 150–400)
RBC: 3.39 MIL/uL — ABNORMAL LOW (ref 3.87–5.11)
RDW: 13.4 % (ref 11.5–15.5)
WBC: 8 10*3/uL (ref 4.0–10.5)
nRBC: 0 % (ref 0.0–0.2)

## 2023-11-29 LAB — COMPREHENSIVE METABOLIC PANEL WITH GFR
ALT: <5 U/L (ref 0–44)
AST: 14 U/L — ABNORMAL LOW (ref 15–41)
Albumin: 2.9 g/dL — ABNORMAL LOW (ref 3.5–5.0)
Alkaline Phosphatase: 41 U/L (ref 38–126)
Anion gap: 8 (ref 5–15)
BUN: 19 mg/dL (ref 8–23)
CO2: 30 mmol/L (ref 22–32)
Calcium: 8.8 mg/dL — ABNORMAL LOW (ref 8.9–10.3)
Chloride: 103 mmol/L (ref 98–111)
Creatinine, Ser: 0.68 mg/dL (ref 0.44–1.00)
GFR, Estimated: >60 mL/min (ref >60)
Glucose, Bld: 87 mg/dL (ref 70–99)
Potassium: 3 mmol/L — ABNORMAL LOW (ref 3.5–5.1)
Sodium: 141 mmol/L (ref 135–145)
Total Bilirubin: 0.6 mg/dL (ref 0.0–1.2)
Total Protein: 5.6 g/dL — ABNORMAL LOW (ref 6.5–8.1)

## 2023-11-29 LAB — ECHOCARDIOGRAM COMPLETE BUBBLE STUDY
AR max vel: 1.8 cm2
AV Area VTI: 2.08 cm2
AV Area mean vel: 1.94 cm2
AV Mean grad: 3 mmHg
AV Peak grad: 4.8 mmHg
Ao pk vel: 1.1 m/s
Area-P 1/2: 4.08 cm2
MV VTI: 1.53 cm2
S' Lateral: 2.4 cm

## 2023-11-29 MED ORDER — LEVETIRACETAM ER 1500 MG PO TB24: 1500.0000 mg | ORAL_TABLET | Freq: Every day | ORAL | 0 refills | Status: AC

## 2023-11-29 MED ORDER — OXYBUTYNIN CHLORIDE ER 5 MG PO TB24: 5.0000 mg | ORAL_TABLET | Freq: Every day | ORAL | 0 refills | Status: AC

## 2023-11-29 MED ORDER — POTASSIUM CHLORIDE CRYS ER 20 MEQ PO TBCR
40.0000 meq | EXTENDED_RELEASE_TABLET | Freq: Once | ORAL | Status: AC
  Administered 2023-11-29: 40 meq via ORAL
  Filled 2023-11-29: qty 2

## 2023-11-29 MED ORDER — ASPIRIN 81 MG PO TBEC: 81.0000 mg | DELAYED_RELEASE_TABLET | Freq: Every day | ORAL | 12 refills | Status: AC

## 2023-11-29 NOTE — Discharge Instructions (Signed)
 [facility] services

## 2023-11-29 NOTE — Discharge Summary (Addendum)
 Physician Discharge Summary   Patient: Carrie Singleton MRN: [id number] DOB: Dec 25, 1925  Admit date:     11/27/2023  Discharge date: 11/29/23  Discharge Physician: Jonah Blue   PCP: Pcp, No   Recommendations at discharge:   You are being discharged home with resumption of hospice services - order written Take 81 mg aspirin daily Increase Keppra to 1500 mg nightly Follow up with neurology (Dr. Sherryll Burger) in 1 month  Discharge Diagnoses: Principal Problem:   Stroke-like symptoms Active Problems:   Somnolence, daytime   Partial epilepsy with impairment of consciousness (HCC)   Parkinson disease (HCC)   HTN (hypertension)   Anxiety and depression   Urinary incontinence   Constipation    Hospital Course: 89yo with h/o seizure d/o, Parkinson's with dementia, mood d/o, and dysphagia who presented on 3/27 with a code stroke.  She is currently enrolled with [facility] and family intends to continue hospice at the time of dc.  Assessment and Plan:  Stroke-like symptoms Patient presented with stroke-like symptoms Negative MRI Likely TIA vs. Seizure with Todd's paralysis Symptoms resolved She is already home with hospice and, while recommended for home PT/OT, is likely to benefit more from resumption of hospice  Echo on 3/27 with grade 1 DD, myoxmatous MV, bubble study ordered for 3/28 Neurology consulted Does not need statin therapy DC to home today Take 81 mg ASA daily   Somnolence, daytime Patient's son reports increased daytime somnolence past couple of months Likely associated with aging and/or dementia No intervention is needed   Partial epilepsy with impairment of consciousness History of [place] Syndrome/perceptual distortion Last seizure was 10 years ago Increase Keppra ER to 1500 mg daily Follow up (once) with Dr. Sherryll Burger in 1 month  Meningioma Incidental finding on MRI Does not need further evaluation or treatment   Parkinson disease  Continue  Sinemet 25/100 3 times daily   HTN (hypertension) Not currently on any medication BP controlled without medication throughout hospitalization   Anxiety and depression Cognitive deficits Son states he does not think his mother is depressed so he does not give her the sertraline or lorazepam  Delirium precautions   Urinary incontinence Continue oxybutynin   Constipation Continue bisacodyl suppositories as needed     Consultants: Neurology PT OT [facility] team   Procedures: Echocardiogram   Antibiotics: None    Pain control - [place] Controlled Substance Reporting System database was reviewed. and patient was instructed, not to drive, operate heavy machinery, perform activities at heights, swimming or participation in water activities or provide baby-sitting services while on Pain, Sleep and Anxiety Medications; until their outpatient Physician has advised to do so again. Also recommended to not to take more than prescribed Pain, Sleep and Anxiety Medications.    Disposition: Home Diet recommendation:  Dysphagia 3 diet, nectar thick liquids DISCHARGE MEDICATION: Allergies as of 11/29/2023       Reactions   Shellfish Allergy Swelling   Shrimp Throat swelling   Grapefruit Extract Other (See Comments)   Contraindicated due to medication        Medication List     STOP taking these medications    levETIRAcetam 750 MG tablet Commonly known as: KEPPRA Replaced by: levETIRAcetam ER 1500 MG Tb24   sertraline 50 MG tablet Commonly known as: ZOLOFT       TAKE these medications    acetaminophen 650 MG CR tablet Commonly known as: TYLENOL Take 650 mg by mouth every 8 (eight) hours as needed for  pain.   acetaminophen 650 MG suppository Commonly known as: TYLENOL Place 650 mg rectally every 6 (six) hours as needed for fever or mild pain (pain score 1-3).   aspirin EC 81 MG tablet Take 1 tablet (81 mg total) by mouth daily. Swallow whole. Start taking on:  November 30, 2023   bisacodyl 10 MG suppository Commonly known as: DULCOLAX Place 10 mg rectally daily as needed for mild constipation or moderate constipation.   carbidopa-levodopa 25-100 MG tablet Commonly known as: SINEMET IR Take 1 tablet by mouth 3 (three) times daily.   hyoscyamine 0.125 MG tablet Commonly known as: LEVSIN Take 0.125 mg by mouth every 4 (four) hours as needed (secretions).   levETIRAcetam ER 1500 MG Tb24 Take 1,500 mg by mouth at bedtime. Replaces: levETIRAcetam 750 MG tablet   LORazepam 0.5 MG tablet Commonly known as: ATIVAN Take 0.5 mg by mouth every 6 (six) hours as needed for anxiety.   mirtazapine 15 MG tablet Commonly known as: REMERON Take 15 mg by mouth at bedtime.   morphine CONCENTRATE 10 mg / 0.5 ml concentrated solution Take 5 mg by mouth every 3 (three) hours as needed for shortness of breath or moderate pain (pain score 4-6).   oxybutynin 5 MG 24 hr tablet Commonly known as: DITROPAN-XL Take 1 tablet (5 mg total) by mouth at bedtime.   prochlorperazine 10 MG tablet Commonly known as: COMPAZINE Take 10 mg by mouth every 6 (six) hours as needed for nausea or vomiting.        Discharge Exam:    Subjective: She sometimes has seizures but this was different - head cocked, disoriented, tunnel vision, completely out of it. He was unable to get her off the commode. She is doing well with hospice at home. They come in and bathe her, "that's basically it." The nurse checks on her once a week. He doesn't think she is taking all of her medications; he will start administering the medications himself. Hospice changed her seizure medication from 1 pill to 4 pills twice a day and he doesn't think she has been taking it correctly. He doesn't know what she will be like once she gets home - she sleeps a lot, gets up about 1pm, he "stages everything" so that her breakfast is available, has an Ensure for lunch. He had to "turn off hospice in order to get  the hospital bill paid through [facility]".    Objective: Vitals:   11/29/23 0625 11/29/23 0736  BP: (!) 106/54 (!) 124/55  Pulse: 65 62  Resp: 18 16  Temp: 98.5 F (36.9 C) 98.1 F (36.7 C)  SpO2: 92% 93%   No intake or output data in the 24 hours ending 11/29/23 1416 Filed Weights   11/27/23 2011  Weight: 53.7 kg    Exam:  General:  Appears calm and comfortable and is in NAD Eyes:  EOMI, normal lids, iris ENT:  grossly normal hearing, lips & tongue, mmm Neck:  no LAD, masses or thyromegaly Cardiovascular:  RRR, no m/r/g. No LE edema.  Respiratory:   CTA bilaterally with no wheezes/rales/rhonchi.  Normal respiratory effort. Abdomen:  soft, NT, ND Skin:  no rash or induration seen on limited exam Musculoskeletal:  grossly normal tone BUE/BLE, good ROM, no bony abnormality Psychiatric:  pleasantly confused mood and affect, speech fluent and appropriate, AOx1-2 Neurologic:  CN 2-12 grossly intact, moves all extremities in coordinated fashion  Data Reviewed: I have reviewed the patient's lab results since admission.  Pertinent  labs for today include:  K+ 2.9 Lipids: 210/46/136/140 WBC 8 Hgb 10.6      Condition at discharge: fair  The results of significant diagnostics from this hospitalization (including imaging, microbiology, ancillary and laboratory) are listed below for reference.   Imaging Studies: ECHOCARDIOGRAM COMPLETE Result Date: 11/28/2023    ECHOCARDIOGRAM REPORT   Patient Name:   Carrie Singleton [facility] Date of Exam: 11/28/2023 Medical Rec #:  [id number]     Height:       61.0 in Accession #:    [id number]    Weight:       118.4 lb Date of Birth:  26-Dec-1925      BSA:          1.511 m Patient Age:    89 years      BP:           103/50 mmHg Patient Gender: F             HR:           60 bpm. Exam Location:  [facility] Procedure: 2D Echo, Cardiac Doppler and Color Doppler (Both Spectral and Color            Flow Doppler were utilized during procedure). Indications:     Stroke   History:         Patient has no prior history of Echocardiogram examinations.                  Stroke; Risk Factors:Hypertension.  Sonographer:     Mikki Harbor Referring Phys:  [id number] Andris Baumann Diagnosing Phys: Alwyn Pea MD IMPRESSIONS  1. Left ventricular ejection fraction, by estimation, is 60 to 65%. The left ventricle has normal function. The left ventricle has no regional wall motion abnormalities. Left ventricular diastolic parameters are consistent with Grade I diastolic dysfunction (impaired relaxation).  2. Right ventricular systolic function is normal. The right ventricular size is normal. There is normal pulmonary artery systolic pressure.  3. The mitral valve is myxomatous. Trivial mitral valve regurgitation.  4. The aortic valve is normal in structure. Aortic valve regurgitation is not visualized. FINDINGS  Left Ventricle: Left ventricular ejection fraction, by estimation, is 60 to 65%. The left ventricle has normal function. The left ventricle has no regional wall motion abnormalities. Strain was performed and the global longitudinal strain is indeterminate. Global longitudinal strain performed but not reported based on interpreter judgement due to suboptimal tracking. The left ventricular internal cavity size was normal in size. There is no left ventricular hypertrophy. Left ventricular diastolic parameters are consistent with Grade I diastolic dysfunction (impaired relaxation). Right Ventricle: The right ventricular size is normal. No increase in right ventricular wall thickness. Right ventricular systolic function is normal. There is normal pulmonary artery systolic pressure. The tricuspid regurgitant velocity is 2.29 m/s, and  with an assumed right atrial pressure of 3 mmHg, the estimated right ventricular systolic pressure is 24.0 mmHg. Left Atrium: Left atrial size was normal in size. Right Atrium: Right atrial size was normal in size. Pericardium: There is no evidence of  pericardial effusion. Mitral Valve: Thickening of the distal anterior leaflet ,calcified can not rule out veg. The mitral valve is myxomatous. There is moderate thickening of the anterior mitral valve leaflet(s). There is moderate calcification of the anterior mitral valve leaflet(s). Normal mobility of the mitral valve leaflets. Trivial mitral valve regurgitation. MV peak gradient, 2.5 mmHg. The mean mitral valve gradient is 1.0 mmHg. Tricuspid Valve: The tricuspid valve  is normal in structure. Tricuspid valve regurgitation is trivial. Aortic Valve: The aortic valve is normal in structure. Aortic valve regurgitation is not visualized. Aortic valve mean gradient measures 3.0 mmHg. Aortic valve peak gradient measures 4.8 mmHg. Aortic valve area, by VTI measures 2.09 cm. Pulmonic Valve: The pulmonic valve was normal in structure. Pulmonic valve regurgitation is not visualized. Aorta: The ascending aorta was not well visualized. IAS/Shunts: No atrial level shunt detected by color flow Doppler. Additional Comments: 3D was performed not requiring image post processing on an independent workstation and was indeterminate.  LEFT VENTRICLE PLAX 2D LVIDd:         4.00 cm   Diastology LVIDs:         2.70 cm   LV e' medial:    7.94 cm/s LV PW:         0.90 cm   LV E/e' medial:  7.7 LV IVS:        1.00 cm   LV e' lateral:   9.57 cm/s LVOT diam:     1.90 cm   LV E/e' lateral: 6.4 LV SV:         53 LV SV Index:   35 LVOT Area:     2.84 cm  RIGHT VENTRICLE RV Basal diam:  2.95 cm RV Mid diam:    2.90 cm RV S prime:     13.10 cm/s TAPSE (M-mode): 2.2 cm LEFT ATRIUM             Index        RIGHT ATRIUM           Index LA diam:        2.40 cm 1.59 cm/m   RA Area:     11.00 cm LA Vol (A2C):   31.9 ml 21.11 ml/m  RA Volume:   25.00 ml  16.54 ml/m LA Vol (A4C):   16.7 ml 11.05 ml/m LA Biplane Vol: 23.0 ml 15.22 ml/m  AORTIC VALVE                    PULMONIC VALVE AV Area (Vmax):    2.23 cm     PV Vmax:       0.80 m/s AV Area  (Vmean):   1.92 cm     PV Peak grad:  2.6 mmHg AV Area (VTI):     2.09 cm AV Vmax:           109.00 cm/s AV Vmean:          77.000 cm/s AV VTI:            0.255 m AV Peak Grad:      4.8 mmHg AV Mean Grad:      3.0 mmHg LVOT Vmax:         85.70 cm/s LVOT Vmean:        52.100 cm/s LVOT VTI:          0.188 m LVOT/AV VTI ratio: 0.74  AORTA Ao Root diam: 3.00 cm MITRAL VALVE               TRICUSPID VALVE MV Area (PHT): 2.94 cm    TR Peak grad:   21.0 mmHg MV Area VTI:   2.09 cm    TR Vmax:        229.00 cm/s MV Peak grad:  2.5 mmHg MV Mean grad:  1.0 mmHg    SHUNTS MV Vmax:       0.79 m/s  Systemic VTI:  0.19 m MV Vmean:      41.0 cm/s   Systemic Diam: 1.90 cm MV Decel Time: 258 msec MV E velocity: 60.90 cm/s MV A velocity: 69.20 cm/s MV E/A ratio:  0.88 Dwayne D Callwood MD Electronically signed by Alwyn Pea MD Signature Date/Time: 11/28/2023/5:32:17 PM    Final    MR BRAIN WO CONTRAST Result Date: 11/28/2023 CLINICAL DATA:  Neuro deficit, acute, stroke suspected EXAM: MRI HEAD WITHOUT CONTRAST TECHNIQUE: Multiplanar, multiecho pulse sequences of the brain and surrounding structures were obtained without intravenous contrast. COMPARISON:  CT head March 27 25. FINDINGS: Brain: No acute infarction, hemorrhage, hydrocephalus, or extra-axial collection. Approximately 1 cm probable meningioma along the right parietal convexity. Moderate T2/FLAIR hyperintensities in the white matter, compatible with chronic microvascular ischemic disease. Remote right cerebellar infarct. Remote right basal ganglia lacunar infarct. Vascular: Normal flow voids. Skull and upper cervical spine: Normal marrow signal. Sinuses/Orbits: Clear sinuses.  No acute orbital findings. Other: No mastoid effusions. IMPRESSION: 1. No evidence of acute intracranial abnormality. 2. Moderate chronic microvascular ischemic disease. 3. Remote right cerebellar and basal ganglia infarcts. 4. Approximately 1 cm probable meningioma along the right  parietal convexity. No mass effect. Electronically Signed   By: Feliberto Harts M.D.   On: 11/28/2023 02:22   DG Chest Port 1 View Result Date: 11/27/2023 CLINICAL DATA:  Leukocytosis EXAM: PORTABLE CHEST 1 VIEW COMPARISON:  None Available. FINDINGS: Cardiac shadows within normal limits. The lungs are hyperinflated. No focal infiltrate is seen. Aortic calcifications are noted. No bony abnormality is noted IMPRESSION: No acute abnormality seen. Electronically Signed   By: Alcide Clever M.D.   On: 11/27/2023 23:01   CT ANGIO HEAD NECK W WO CM W PERF (CODE STROKE) Result Date: 11/27/2023 CLINICAL DATA:  Stroke EXAM: CT ANGIOGRAPHY HEAD AND NECK CT PERFUSION BRAIN TECHNIQUE: Multidetector CT imaging of the head and neck was performed using the standard protocol during bolus administration of intravenous contrast. Multiplanar CT image reconstructions and MIPs were obtained to evaluate the vascular anatomy. Carotid stenosis measurements (when applicable) are obtained utilizing NASCET criteria, using the distal internal carotid diameter as the denominator. Multiphase CT imaging of the brain was performed following IV bolus contrast injection. Subsequent parametric perfusion maps were calculated using RAPID software. RADIATION DOSE REDUCTION: This exam was performed according to the departmental dose-optimization program which includes automated exposure control, adjustment of the mA and/or kV according to patient size and/or use of iterative reconstruction technique. CONTRAST:  OMNIPAQUE IOHEXOL 350 MG/ML SOLN COMPARISON:  Same day CT head. FINDINGS: CTA NECK FINDINGS Aortic arch: Extensive atherosclerosis of the aorta. Moderate stenosis of the right subclavian artery origin. 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 Right carotid system: Ulcerated atherosclerosis involving the carotid bifurcation and proximal ICA with approximately 40% stenosis. Left carotid system: Ulcerated  atherosclerosis involving the carotid bifurcation and proximal ICA with approximately 40% stenosis. Vertebral arteries: Left dominant. Patent without greater than 50% stenosis. Skeleton: No acute abnormality on limited assessment. Other neck: No acute abnormality limits assessment. Approximately 2.2 cm left thyroid nodule. Upper chest: Visualized lung apices are clear.  Emphysema. Review of the MIP images confirms the above findings CTA HEAD FINDINGS Anterior circulation: Bilateral intracranial ICAs, MCAs, and ACAs are patent without proximal hemodynamically significant stenosis. 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 Posterior circulation: Bilateral intradural vertebral arteries, basilar artery and bilateral posterior cerebral arteries are patent without proximal hemodynamically significant  stenosis. Venous sinuses: As permitted by contrast timing, patent. Review of the MIP images confirms the above findings CT Brain Perfusion Findings: ASPECTS: 10 CBF (<30%) Volume: 0 mL Perfusion (Tmax>6.0s) volume: 8mL, likely artifactual given location in the inferior left frontal lobe at a site of streak artifact. Mismatch Volume: 8mL Infarction Location:None identified IMPRESSION: 1. No large vessel occlusion or proximal hemodynamically significant stenosis. 2. No convincing penumbra/mismatch or core infarct on CT perfusion. 3. 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 4. Approximately 2.2 cm left thyroid nodule. Recommend thyroid US (ref: J Am Coll Radiol. 2015 Feb;12(2): 143-50). 5. Aortic Atherosclerosis (ICD10-I70.0) and Emphysema (ICD10-J43.9). Electronically Signed   By: Feliberto Harts M.D.   On: 11/27/2023 20:58   CT HEAD CODE STROKE WO CONTRAST Result Date: 11/27/2023 CLINICAL DATA:  Code stroke. Neuro deficit, left-sided facial droop and weakness. EXAM: CT HEAD WITHOUT CONTRAST TECHNIQUE: Contiguous axial images were obtained from the base of the skull  through the vertex without intravenous contrast. RADIATION DOSE REDUCTION: This exam was performed according to the departmental dose-optimization program which includes automated exposure control, adjustment of the mA and/or kV according to patient size and/or use of iterative reconstruction technique. COMPARISON:  CT head 07/20/2014 FINDINGS: Brain: No acute intracranial hemorrhage. No CT evidence of acute infarct. Nonspecific hypoattenuation in the periventricular and subcortical white matter favored to reflect chronic microvascular ischemic changes. Remote infarct in the right corona radiata extending into the basal ganglia is new since 2015. Additional small remote lacunar infarct in the left thalamus. Similar appearance of calcified meningioma versus exostosis over the posterior right parietal lobe. No edema, mass effect, or midline shift. The basilar cisterns are patent. Ventricles: Prominence of the ventricles suggesting underlying parenchymal volume loss. Vascular: Atherosclerotic calcifications of the carotid siphons. No hyperdense vessel. Skull: No acute or aggressive finding. Orbits: Orbits are symmetric. Sinuses: The visualized paranasal sinuses are clear. Other: Mastoid air cells are clear. ASPECTS [facility] Stroke Program Early CT Score) - Ganglionic level infarction (caudate, lentiform nuclei, internal capsule, insula, M1-M3 cortex): 7 - Supraganglionic infarction (M4-M6 cortex): 3 Total score (0-10 with 10 being normal): 10 IMPRESSION: 1. No CT evidence of acute intracranial abnormality. 2. Moderate chronic microvascular ischemic changes and parenchymal volume loss. 3. Remote infarct in the right corona radiata extending into the basal ganglia is new since 2015. Additional remote lacunar infarct in the left thalamus is new since 2015. 4. ASPECTS is 10 These results were communicated to Dr. Scotty Court at 8:10 pm on 11/27/2023 by phone call. Electronically Signed   By: Emily Filbert M.D.   On: 11/27/2023  20:11    Microbiology: Results for orders placed or performed during the hospital encounter of 11/27/23  Resp panel by RT-PCR (RSV, Flu A&B, Covid) Anterior Nasal Swab     Status: None   Collection Time: 11/28/23 12:23 AM   Specimen: Anterior Nasal Swab  Result Value Ref Range Status   SARS Coronavirus 2 by RT PCR NEGATIVE NEGATIVE Final    Comment: (NOTE) SARS-CoV-2 target nucleic acids are NOT DETECTED.  The SARS-CoV-2 RNA is generally detectable in upper respiratory specimens during the acute phase of infection. The lowest concentration of SARS-CoV-2 viral copies this assay can detect is 138 copies/mL. A negative result does not preclude SARS-Cov-2 infection and should not be used as the sole basis for treatment or other patient management decisions. A negative result may occur with  improper specimen collection/handling, submission of specimen other than nasopharyngeal swab, presence of viral mutation(s) within the  areas targeted by this assay, and inadequate number of viral copies(<138 copies/mL). A negative result must be combined with clinical observations, patient history, and epidemiological information. The expected result is Negative.  Fact Sheet for Patients:  BloggerCourse.com  Fact Sheet for Healthcare Providers:  SeriousBroker.it  This test is no t yet approved or cleared by the [place] FDA and  has been authorized for detection and/or diagnosis of SARS-CoV-2 by FDA under an Emergency Use Authorization (EUA). This EUA will remain  in effect (meaning this test can be used) for the duration of the COVID-19 declaration under Section 564(b)(1) of the Act, 21 U.S.C.section 360bbb-3(b)(1), unless the authorization is terminated  or revoked sooner.       Influenza A by PCR NEGATIVE NEGATIVE Final   Influenza B by PCR NEGATIVE NEGATIVE Final    Comment: (NOTE) The Xpert Xpress SARS-CoV-2/FLU/RSV plus assay is  intended as an aid in the diagnosis of influenza from Nasopharyngeal swab specimens and should not be used as a sole basis for treatment. Nasal washings and aspirates are unacceptable for Xpert Xpress SARS-CoV-2/FLU/RSV testing.  Fact Sheet for Patients: BloggerCourse.com  Fact Sheet for Healthcare Providers: SeriousBroker.it  This test is not yet approved or cleared by the [place] FDA and has been authorized for detection and/or diagnosis of SARS-CoV-2 by FDA under an Emergency Use Authorization (EUA). This EUA will remain in effect (meaning this test can be used) for the duration of the COVID-19 declaration under Section 564(b)(1) of the Act, 21 U.S.C. section 360bbb-3(b)(1), unless the authorization is terminated or revoked.     Resp Syncytial Virus by PCR NEGATIVE NEGATIVE Final    Comment: (NOTE) Fact Sheet for Patients: BloggerCourse.com  Fact Sheet for Healthcare Providers: SeriousBroker.it  This test is not yet approved or cleared by the [place] FDA and has been authorized for detection and/or diagnosis of SARS-CoV-2 by FDA under an Emergency Use Authorization (EUA). This EUA will remain in effect (meaning this test can be used) for the duration of the COVID-19 declaration under Section 564(b)(1) of the Act, 21 U.S.C. section 360bbb-3(b)(1), unless the authorization is terminated or revoked.  Performed at [facility], [address] [place], [place] [address]     Labs: CBC: Recent Labs  Lab 11/27/23 1945 11/29/23 0442  WBC 10.9* 8.0  NEUTROABS 9.5* 5.3  HGB 13.1 10.6*  HCT 41.4 32.2*  MCV 97.6 95.0  PLT 262 200   Basic Metabolic Panel: Recent Labs  Lab 11/27/23 2055 11/29/23 0442  NA 137 141  K 3.7 3.0*  CL 100 103  CO2 26 30  GLUCOSE 117* 87  BUN 19 19  CREATININE 0.75 0.68  CALCIUM 8.4* 8.8*   Liver Function  Tests: Recent Labs  Lab 11/27/23 2055 11/29/23 0442  AST 19 14*  ALT 7 <5  ALKPHOS 47 41  BILITOT 0.9 0.6  PROT 6.5 5.6*  ALBUMIN 3.3* 2.9*   CBG: Recent Labs  Lab 11/27/23 2014  GLUCAP 127*    Discharge time spent: greater than 30 minutes.  Signed: Jonah Blue, MD [facility] 11/29/2023

## 2023-11-29 NOTE — TOC Transition Note (Addendum)
 Transition of Care [facility]) - Discharge Note   Patient Details  Name: Carrie Singleton MRN: [id number] Date of Birth: 21-Jul-1926  Transition of Care [facility]) CM/SW Contact:  Liliana Cline, LCSW Phone Number: 11/29/2023, 1:33 PM   Clinical Narrative:    Patient has orders to DC home today. CSW called [facility] again - spoke to [name] - she will have the on call nurse call - informed her patient is discharging today and I did not hear back yesterday.   2:40- Call from [place] with [facility]. Informed her of patient DC home today. Faxed hospice order as requested.   Final next level of care: Home w Hospice Care Barriers to Discharge: Barriers Resolved   Patient Goals and CMS Choice Patient states their goals for this hospitalization and ongoing recovery are:: home with continued hospice care CMS Medicare.gov Compare Post Acute Care list provided to:: Patient Represenative (must comment) Choice offered to / list presented to : Adult Children      Discharge Placement                Patient to be transferred to facility by: son [name]   Patient and family notified of of transfer: 11/29/23  Discharge Plan and Services Additional resources added to the After Visit Summary for                                       Social Drivers of Health (SDOH) Interventions SDOH Screenings   Food Insecurity: No Food Insecurity (11/28/2023)  Housing: Low Risk  (11/28/2023)  Transportation Needs: No Transportation Needs (11/28/2023)  Utilities: Not At Risk (11/28/2023)  Financial Resource Strain: Low Risk  (04/21/2023)   Received from [facility]  Social Connections: Moderately Isolated (11/28/2023)  Tobacco Use: Low Risk  (11/28/2023)     Readmission Risk Interventions     No data to display

## 2023-11-29 NOTE — Hospital Course (Addendum)
 89yo with h/o seizure d/o, Parkinson's with dementia, mood d/o, and dysphagia who presented on 3/27 with a code stroke.  She is currently enrolled with [facility] and family intends to continue hospice at the time of dc.

## 2023-11-29 NOTE — Plan of Care (Signed)
  Problem: Education: Goal: Knowledge of disease or condition will improve Outcome: Progressing   Problem: Ischemic Stroke/TIA Tissue Perfusion: Goal: Complications of ischemic stroke/TIA will be minimized Outcome: Progressing   Problem: Coping: Goal: Will verbalize positive feelings about self Outcome: Progressing Goal: Will identify appropriate support needs Outcome: Progressing   Problem: Self-Care: Goal: Ability to participate in self-care as condition permits will improve Outcome: Progressing

## 2023-11-29 NOTE — Plan of Care (Signed)
  Problem: Ischemic Stroke/TIA Tissue Perfusion: Goal: Complications of ischemic stroke/TIA will be minimized Outcome: Progressing   Problem: Coping: Goal: Will identify appropriate support needs Outcome: Progressing   Problem: Self-Care: Goal: Ability to participate in self-care as condition permits will improve Outcome: Progressing

## 2023-11-29 NOTE — Progress Notes (Signed)
*  PRELIMINARY RESULTS* Echocardiogram 2D Echocardiogram has been performed.  Carrie Singleton 11/29/2023, 12:31 PM

## 2023-11-29 NOTE — Progress Notes (Signed)
 Pt's son, Otilio Carpen, called this writer back; advised me that the pt uses [facility] on [address] in [place], [place].  This pharmacy was added to the pt's pharmacy in the navagator.

## 2023-11-29 NOTE — Plan of Care (Signed)
  Problem: Education: Goal: Knowledge of disease or condition will improve Outcome: Adequate for Discharge Goal: Knowledge of secondary prevention will improve (MUST DOCUMENT ALL) Outcome: Adequate for Discharge Goal: Knowledge of patient specific risk factors will improve (DELETE if not current risk factor) Outcome: Adequate for Discharge   Problem: Ischemic Stroke/TIA Tissue Perfusion: Goal: Complications of ischemic stroke/TIA will be minimized Outcome: Adequate for Discharge   Problem: Coping: Goal: Will verbalize positive feelings about self Outcome: Adequate for Discharge Goal: Will identify appropriate support needs Outcome: Adequate for Discharge   Problem: Health Behavior/Discharge Planning: Goal: Ability to manage health-related needs will improve Outcome: Adequate for Discharge Goal: Goals will be collaboratively established with patient/family Outcome: Adequate for Discharge   Problem: Self-Care: Goal: Ability to participate in self-care as condition permits will improve Outcome: Adequate for Discharge Goal: Verbalization of feelings and concerns over difficulty with self-care will improve Outcome: Adequate for Discharge Goal: Ability to communicate needs accurately will improve Outcome: Adequate for Discharge   Problem: Nutrition: Goal: Risk of aspiration will decrease Outcome: Adequate for Discharge Goal: Dietary intake will improve Outcome: Adequate for Discharge   Problem: Education: Goal: Knowledge of General Education information will improve Description: Including pain rating scale, medication(s)/side effects and non-pharmacologic comfort measures Outcome: Adequate for Discharge   Problem: Health Behavior/Discharge Planning: Goal: Ability to manage health-related needs will improve Outcome: Adequate for Discharge   Problem: Clinical Measurements: Goal: Ability to maintain clinical measurements within normal limits will improve Outcome: Adequate for  Discharge Goal: Will remain free from infection Outcome: Adequate for Discharge Goal: Diagnostic test results will improve Outcome: Adequate for Discharge Goal: Respiratory complications will improve Outcome: Adequate for Discharge Goal: Cardiovascular complication will be avoided Outcome: Adequate for Discharge   Problem: Activity: Goal: Risk for activity intolerance will decrease Outcome: Adequate for Discharge   Problem: Nutrition: Goal: Adequate nutrition will be maintained Outcome: Adequate for Discharge   Problem: Coping: Goal: Level of anxiety will decrease Outcome: Adequate for Discharge   Problem: Elimination: Goal: Will not experience complications related to bowel motility Outcome: Adequate for Discharge Goal: Will not experience complications related to urinary retention Outcome: Adequate for Discharge   Problem: Pain Managment: Goal: General experience of comfort will improve and/or be controlled Outcome: Adequate for Discharge   Problem: Safety: Goal: Ability to remain free from injury will improve Outcome: Adequate for Discharge   Problem: Skin Integrity: Goal: Risk for impaired skin integrity will decrease Outcome: Adequate for Discharge   Problem: Acute Rehab OT Goals (only OT should resolve) Goal: Pt. Will Perform Grooming Outcome: Adequate for Discharge Goal: Pt. Will Perform Lower Body Dressing Outcome: Adequate for Discharge Goal: Pt. Will Transfer To Toilet Outcome: Adequate for Discharge Goal: Pt. Will Perform Toileting-Clothing Manipulation Outcome: Adequate for Discharge

## 2023-11-29 NOTE — Progress Notes (Signed)
 MD order received in [facility] to discharge pt home with [facility] services; [facility] previously notified [facility] of pt's discharge; verbally reviewed AVS with pt's son, Otilio Carpen; Rxs escribed to the [facility] in [place]; no questions voiced at this time; pt discharged via wheelchair by nursing to [facility]

## 2023-11-29 NOTE — Progress Notes (Signed)
 Attempted to call the pt's son, Otilio Carpen at [phone] in order to obtain some medical information (need the name of the pt's pharmacy); left voicemail message asking [name] to call [phone]
